# Patient Record
Sex: Female | Born: 1985 | Race: White | Hispanic: No | Marital: Single | Smoking: Current every day smoker
Health system: Southern US, Community
[De-identification: ages and names within clinical notes are randomized; demographics above are authoritative.]

## PROBLEM LIST (undated history)

## (undated) DIAGNOSIS — O99213 Obesity complicating pregnancy, third trimester: Secondary | ICD-10-CM

## (undated) DIAGNOSIS — Z6830 Body mass index (BMI) 30.0-30.9, adult: Secondary | ICD-10-CM

## (undated) HISTORY — PX: NO PAST SURGERIES: SHX2092

---

## 2005-01-28 ENCOUNTER — Emergency Department: Payer: Self-pay | Admitting: Emergency Medicine

## 2006-01-31 ENCOUNTER — Emergency Department: Payer: Self-pay | Admitting: Emergency Medicine

## 2006-04-28 ENCOUNTER — Emergency Department: Payer: Self-pay | Admitting: Emergency Medicine

## 2006-04-28 ENCOUNTER — Other Ambulatory Visit: Payer: Self-pay

## 2006-05-12 ENCOUNTER — Emergency Department: Payer: Self-pay | Admitting: Emergency Medicine

## 2006-08-23 ENCOUNTER — Inpatient Hospital Stay: Payer: Self-pay

## 2007-06-05 ENCOUNTER — Emergency Department: Payer: Self-pay | Admitting: Emergency Medicine

## 2007-08-10 ENCOUNTER — Emergency Department: Payer: Self-pay | Admitting: Internal Medicine

## 2010-11-09 ENCOUNTER — Ambulatory Visit: Payer: Self-pay | Admitting: Internal Medicine

## 2014-07-29 NOTE — L&D Delivery Note (Signed)
Delivery Note At 3:09 PM a viable female was delivered via Vaginal, Spontaneous Delivery (Presentation: Right Occiput Anterior).  APGAR: 9, 10; weight pending  .   Placenta status: Intact, Spontaneous.  Cord: 3 vessels with the following complications: .  Cord pH: n/a  Anesthesia: None  Episiotomy: None Lacerations: 2nd degree;Perineal;Vaginal Suture Repair: 3.0 vicryl in standard fashion Est. Blood Loss (mL): 400  Mom to postpartum.  Baby to Couplet care / Skin to Skin.  Called to see patient.  Mom pushed to delivery viable female infant.  The head followed by shoulders, which delivered without difficulty, and the rest of the body. The right arm was across the face, but moved to minimize perineal trauma.  No nuchal cord noted.  Baby to mom's chest.  Cord clamped and cut after > 1 min delay.  Cord blood obtained.  Placenta delivered spontaneously, intact, with a 3-vessel cord.  Second degree perineal laceration repaired with 3-0 Vicryl in standard fashion.  All counts correct.  Hemostasis obtained with fundal massage. EBL 400 mL.     Conard NovakJackson, Freja Faro D, MD 07/07/2015, 3:36 PM

## 2015-07-07 ENCOUNTER — Encounter: Payer: Self-pay | Admitting: *Deleted

## 2015-07-07 ENCOUNTER — Inpatient Hospital Stay
Admission: EM | Admit: 2015-07-07 | Discharge: 2015-07-08 | DRG: 775 | Disposition: A | Discharge status: Home / Self Care | Payer: Medicaid Other | Attending: Obstetrics and Gynecology | Admitting: Obstetrics and Gynecology

## 2015-07-07 DIAGNOSIS — O99213 Obesity complicating pregnancy, third trimester: Secondary | ICD-10-CM | POA: Diagnosis present

## 2015-07-07 DIAGNOSIS — Z6830 Body mass index (BMI) 30.0-30.9, adult: Secondary | ICD-10-CM

## 2015-07-07 DIAGNOSIS — Z3A38 38 weeks gestation of pregnancy: Secondary | ICD-10-CM

## 2015-07-07 DIAGNOSIS — O99214 Obesity complicating childbirth: Principal | ICD-10-CM | POA: Diagnosis present

## 2015-07-07 HISTORY — DX: Body mass index (BMI) 30.0-30.9, adult: Z68.30

## 2015-07-07 HISTORY — DX: Obesity complicating pregnancy, third trimester: O99.213

## 2015-07-07 LAB — CBC
HEMATOCRIT: 41.5 % (ref 35.0–47.0)
Hemoglobin: 13.9 g/dL (ref 12.0–16.0)
MCH: 32.2 pg (ref 26.0–34.0)
MCHC: 33.5 g/dL (ref 32.0–36.0)
MCV: 95.9 fL (ref 80.0–100.0)
PLATELETS: 179 10*3/uL (ref 150–440)
RBC: 4.32 MIL/uL (ref 3.80–5.20)
RDW: 13.9 % (ref 11.5–14.5)
WBC: 16.3 10*3/uL — AB (ref 3.6–11.0)

## 2015-07-07 LAB — TYPE AND SCREEN
ABO/RH(D): O NEG
Antibody Screen: POSITIVE

## 2015-07-07 LAB — RAPID HIV SCREEN (HIV 1/2 AB+AG)
HIV 1/2 ANTIBODIES: NONREACTIVE
HIV-1 P24 Antigen - HIV24: NONREACTIVE

## 2015-07-07 LAB — ABO/RH: ABO/RH(D): O NEG

## 2015-07-07 MED ORDER — LIDOCAINE HCL (PF) 1 % IJ SOLN
30.0000 mL | INTRAMUSCULAR | Status: DC | PRN
  Administered 2015-07-07: 30 mL via SUBCUTANEOUS
  Filled 2015-07-07: qty 30

## 2015-07-07 MED ORDER — DIBUCAINE 1 % RE OINT: 1.0000 "application " | TOPICAL_OINTMENT | RECTAL | Status: DC | PRN

## 2015-07-07 MED ORDER — ONDANSETRON HCL 4 MG/2ML IJ SOLN: 4.0000 mg | INTRAMUSCULAR | Status: DC | PRN

## 2015-07-07 MED ORDER — LACTATED RINGERS IV SOLN
500.0000 mL | INTRAVENOUS | Status: DC | PRN
Start: 2015-07-07 — End: 2015-07-07

## 2015-07-07 MED ORDER — LACTATED RINGERS IV SOLN: INTRAVENOUS | Status: DC

## 2015-07-07 MED ORDER — ONDANSETRON HCL 4 MG/2ML IJ SOLN: 4.0000 mg | Freq: Four times a day (QID) | INTRAMUSCULAR | Status: DC | PRN

## 2015-07-07 MED ORDER — IBUPROFEN 600 MG PO TABS
600.0000 mg | ORAL_TABLET | Freq: Four times a day (QID) | ORAL | Status: DC
  Administered 2015-07-07 – 2015-07-08 (×4): 600 mg via ORAL
  Filled 2015-07-07 (×5): qty 1

## 2015-07-07 MED ORDER — BENZOCAINE-MENTHOL 20-0.5 % EX AERO: 1.0000 "application " | INHALATION_SPRAY | CUTANEOUS | Status: DC | PRN

## 2015-07-07 MED ORDER — ONDANSETRON HCL 4 MG PO TABS: 4.0000 mg | ORAL_TABLET | ORAL | Status: DC | PRN

## 2015-07-07 MED ORDER — HYDROCODONE-ACETAMINOPHEN 5-325 MG PO TABS
1.0000 | ORAL_TABLET | Freq: Four times a day (QID) | ORAL | Status: DC | PRN
  Administered 2015-07-08: 1 via ORAL
  Filled 2015-07-07: qty 1

## 2015-07-07 MED ORDER — ACETAMINOPHEN 325 MG PO TABS: 650.0000 mg | ORAL_TABLET | ORAL | Status: DC | PRN

## 2015-07-07 MED ORDER — SIMETHICONE 80 MG PO CHEW: 80.0000 mg | CHEWABLE_TABLET | ORAL | Status: DC | PRN

## 2015-07-07 MED ORDER — DIPHENHYDRAMINE HCL 25 MG PO CAPS: 25.0000 mg | ORAL_CAPSULE | Freq: Four times a day (QID) | ORAL | Status: DC | PRN

## 2015-07-07 MED ORDER — HYDROCODONE-ACETAMINOPHEN 5-325 MG PO TABS: 2.0000 | ORAL_TABLET | Freq: Four times a day (QID) | ORAL | Status: DC | PRN

## 2015-07-07 MED ORDER — SENNOSIDES-DOCUSATE SODIUM 8.6-50 MG PO TABS
2.0000 | ORAL_TABLET | ORAL | Status: DC
  Administered 2015-07-07: 2 via ORAL
  Filled 2015-07-07: qty 2

## 2015-07-07 MED ORDER — OXYTOCIN 40 UNITS IN LACTATED RINGERS INFUSION - SIMPLE MED
62.5000 mL/h | INTRAVENOUS | Status: DC
  Filled 2015-07-07: qty 1000

## 2015-07-07 MED ORDER — FERROUS SULFATE 325 (65 FE) MG PO TABS
325.0000 mg | ORAL_TABLET | Freq: Two times a day (BID) | ORAL | Status: DC
  Administered 2015-07-07 – 2015-07-08 (×3): 325 mg via ORAL
  Filled 2015-07-07 (×3): qty 1

## 2015-07-07 MED ORDER — OXYTOCIN 40 UNITS IN LACTATED RINGERS INFUSION - SIMPLE MED
62.5000 mL/h | INTRAVENOUS | Status: DC | PRN
  Filled 2015-07-07: qty 1000

## 2015-07-07 MED ORDER — PRENATAL MULTIVITAMIN CH
1.0000 | ORAL_TABLET | Freq: Every day | ORAL | Status: DC
  Administered 2015-07-08: 1 via ORAL
  Filled 2015-07-07: qty 1

## 2015-07-07 MED ORDER — LANOLIN HYDROUS EX OINT: TOPICAL_OINTMENT | CUTANEOUS | Status: DC | PRN

## 2015-07-07 MED ORDER — OXYTOCIN BOLUS FROM INFUSION: 500.0000 mL | INTRAVENOUS | Status: DC

## 2015-07-07 MED ORDER — OXYTOCIN 10 UNIT/ML IJ SOLN: 10.0000 [IU] | Freq: Once | INTRAMUSCULAR | Status: DC

## 2015-07-07 MED ORDER — WITCH HAZEL-GLYCERIN EX PADS: 1.0000 "application " | MEDICATED_PAD | CUTANEOUS | Status: DC | PRN

## 2015-07-07 MED ORDER — CITRIC ACID-SODIUM CITRATE 334-500 MG/5ML PO SOLN: 30.0000 mL | ORAL | Status: DC | PRN

## 2015-07-07 NOTE — Discharge Summary (Signed)
OB Discharge Summary  Patient Name: Rodman CompMichelle L Caplin DOB: 08/05/1985 MRN: [id number]  Date of admission: 07/07/2015 Delivering MD: Thomasene MohairStephen Jackson, MD Date of Delivery: 07/07/2015  Date of discharge: 07/08/2015  Admitting diagnosis:  Patient Active Problem List   Diagnosis Date Noted  . Labor and delivery, indication for care 07/07/2015  . Obesity affecting pregnancy in third trimester, antepartum   . BMI 30.0-30.9,adult    Intrauterine pregnancy: 6249w1d     Secondary diagnosis: None     Discharge diagnosis: Term Pregnancy Delivered                                                                                                Post partum procedures:rhogam  Augmentation: none  Complications: None  Hospital course:  Onset of Labor With Vaginal Delivery     29 y.o. yo [id number] at 6449w1d was admitted in Active Laboron 07/07/2015. Patient had an uncomplicated labor course as follows:  Membrane Rupture Time/Date: 2:25 PM ,07/07/2015   Intrapartum Procedures: Episiotomy: None [1]                                         Lacerations:  2nd degree [3];Perineal [11];Vaginal [6]  Patient had a delivery of a Viable infant. 07/07/2015  Information for the patient's newborn:  Lissa HoardRussell, Girl Aqsa [[id number]][[id number]]  Delivery Method: Vag-Spont     Pateint had an uncomplicated postpartum course.  She is ambulating, tolerating a regular diet, passing flatus, and urinating well. Patient is discharged home in stable condition on No discharge date for patient encounter.Marland Kitchen.    Physical exam  Filed Vitals:   07/07/15 2352 07/08/15 0356 07/08/15 0748 07/08/15 1255  BP: 90/50 99/61 102/54 106/67  Pulse: 60 52 56 65  Temp: 98 F (36.7 C) 98.3 F (36.8 C) 98 F (36.7 C) 98 F (36.7 C)  TempSrc: Oral Oral Oral Oral  Resp: 20 20 18 20   Height:      Weight:      SpO2:   98%    General: alert Lochia: appropriate Uterine Fundus: firm Incision: N/A DVT Evaluation: No evidence of DVT seen on physical  exam.  Labs: Lab Results  Component Value Date   WBC 17.7* 07/08/2015   HGB 12.5 07/08/2015   HCT 36.4 07/08/2015   MCV 94.5 07/08/2015   PLT 152 07/08/2015   No flowsheet data found.  Discharge instruction: per After Visit Summary.  Medications:    Medication List    TAKE these medications        docusate sodium 100 MG capsule  Commonly known as:  COLACE  Take 1 capsule (100 mg total) by mouth 2 (two) times daily.     HYDROcodone-acetaminophen 5-325 MG tablet  Commonly known as:  NORCO/VICODIN  Take 1 tablet by mouth every 6 (six) hours as needed for moderate pain or severe pain.     ibuprofen 600 MG tablet  Commonly known as:  ADVIL,MOTRIN  Take 1 tablet (600 mg total) by mouth every 6 (six) hours  as needed.     norethindrone 0.35 MG tablet  Commonly known as:  MICRONOR,CAMILA,ERRIN  Take 1 tablet (0.35 mg total) by mouth daily.  Start taking on:  07/28/2015     prenatal multivitamin Tabs tablet  Take 1 tablet by mouth daily at 12 noon.        Diet: routine diet  Activity: Advance as tolerated. Pelvic rest for 6 weeks.   Outpatient follow up: No Follow-up on file.  Postpartum contraception: Progesterone only pills Rhogam Given postpartum: not needed Rubella vaccine given postpartum: no Varicella vaccine given postpartum: no TDaP given antepartum or postpartum: TDaP given 03/03/15 Flu vaccine given antepartum or postpartum: yes  Newborn Data: Live born female  Birth Weight:   APGAR: 9, 10   Baby Feeding: Breast  Disposition:home with mother  Cornelia Copa MD [facility]  Pager: [phone]

## 2015-07-07 NOTE — H&P (Signed)
OB History & Physical   History of Present Illness:  Chief Complaint: contractions  HPI:  Allison Bautista is a 29 y.o. 52P1001 female at 6370w1d dated by LMP consistent with 8 wk US.  Her pregnancy has been complicated by obesity with bmi 30, chlamydia with neg toc 06/28/15.    She reports contractions.   She denies leakage of fluid.   She denies vginal bleeding.   She reports fetal movement.    Maternal Medical History:   Past Medical History  Diagnosis Date  . Obesity affecting pregnancy in third trimester, antepartum   . BMI 30.0-30.9,adult     Past Surgical History  Procedure Laterality Date  . No past surgeries      No Known Allergies  Prior to Admission medications   Not on File       OB History  Gravida Para Term Preterm AB SAB TAB Ectopic Multiple Living  2 1 1       1     # Outcome Date GA Lbr Len/2nd Weight Sex Delivery Anes PTL Lv  2 Current           1 Term 08/23/06    Heide ScalesM Vag-Spont   Y      [facility]  Social History: She  reports that she has been smoking Cigarettes.  She has been smoking about 0.50 packs per day. She does not have any smokeless tobacco history on file. She reports that she does not drink alcohol or use illicit drugs.  Family History: family history is not on file.   Review of Systems: Negative x 10 systems reviewed except as noted in the HPI.    Physical Exam:  Vital Signs: BP 123/87 mmHg  Pulse 102  Temp(Src) 98.3 F (36.8 C) (Oral)  Resp 18  Ht 5\' 4"  (1.626 m)  Wt 82.101 kg (181 lb)  BMI 31.05 kg/m2  LMP 10/13/2014 General: no acute distress.  HEENT: normocephalic, atraumatic Heart: regular rate & rhythm.  No murmurs/rubs/gallops Lungs: clear to auscultation bilaterally Abdomen: soft, gravid, non-tender;  EFW: 7 pounds Pelvic:   External: Normal external female genitalia  Cervix: Dilation: 7 / Effacement (%): 90 / Station: -2    Extremities: non-tender, symmetric,   Neurologic: Alert & oriented  x 3.    Pertinent Results:  Prenatal Labs: Blood type/Rh O negative  Antibody screen negative  Rubella Varicella Immune Immune  RPR NR  HBsAg negative  HIV negative  GC negative  Chlamydia Positive Neg TOC in July and Nov 2016  Genetic screening Neg 1st trimester  1 hour GTT passed  3 hour GTT NA  GBS negative on 06/28/15   Baseline FHR: 140 beats/min   Variability: moderate   Accelerations: present   Decelerations: absent Contractions: present frequency: 4-5 Q10 m Overall assessment: Category 1    Assessment:  Allison Bautista is a 29 y.o. 232P1001 female at 270w1d with Active Labor.   Plan:  1. Admit to Labor & Delivery  2. CBC, T&S, Clrs, IVF 3. GBS negative.   4. Fetal well-being: Reassuring  Tresea MallGLEDHILL,Wylee Dorantes, CNM 07/07/2015 1:42 PM   This patient and plan was discussed with Dr Jean RosenthalJackson  07/07/2015

## 2015-07-08 LAB — CBC
HEMATOCRIT: 36.4 % (ref 35.0–47.0)
HEMOGLOBIN: 12.5 g/dL (ref 12.0–16.0)
MCH: 32.4 pg (ref 26.0–34.0)
MCHC: 34.2 g/dL (ref 32.0–36.0)
MCV: 94.5 fL (ref 80.0–100.0)
Platelets: 152 10*3/uL (ref 150–440)
RBC: 3.85 MIL/uL (ref 3.80–5.20)
RDW: 13.5 % (ref 11.5–14.5)
WBC: 17.7 10*3/uL — AB (ref 3.6–11.0)

## 2015-07-08 MED ORDER — IBUPROFEN 600 MG PO TABS: 600.0000 mg | ORAL_TABLET | Freq: Four times a day (QID) | ORAL | Status: DC | PRN

## 2015-07-08 MED ORDER — INFLUENZA VAC SPLIT QUAD 0.5 ML IM SUSY: 0.5000 mL | PREFILLED_SYRINGE | INTRAMUSCULAR | Status: DC

## 2015-07-08 MED ORDER — HYDROCODONE-ACETAMINOPHEN 5-325 MG PO TABS: 1.0000 | ORAL_TABLET | Freq: Four times a day (QID) | ORAL | Status: DC | PRN

## 2015-07-08 MED ORDER — DOCUSATE SODIUM 100 MG PO CAPS: 100.0000 mg | ORAL_CAPSULE | Freq: Two times a day (BID) | ORAL | Status: DC

## 2015-07-08 MED ORDER — TETANUS-DIPHTH-ACELL PERTUSSIS 5-2.5-18.5 LF-MCG/0.5 IM SUSP: 0.5000 mL | Freq: Once | INTRAMUSCULAR | Status: DC

## 2015-07-08 MED ORDER — PRENATAL MULTIVITAMIN CH: 1.0000 | ORAL_TABLET | Freq: Every day | ORAL | Status: DC

## 2015-07-08 MED ORDER — NORETHINDRONE 0.35 MG PO TABS: 1.0000 | ORAL_TABLET | Freq: Every day | ORAL | Status: DC

## 2015-07-08 NOTE — Progress Notes (Signed)
All pt discharge instructions completed; pt discharged at this time via wheelchair escorted by RN; pt going home with husband, new baby and other family members

## 2015-07-08 NOTE — Progress Notes (Signed)
Daily Post Partum Note  Allison Bautista is a 29 y.o. [id number] PPD#1 s/p  SVD/2nd  @ 8152w1d.  Pregnancy c/b Rh negative and h/o CT with negative TOC  24hr/overnight events:  none  Subjective:  Meeting all PP goals  Objective:    Current Vital Signs 24h Vital Sign Ranges  T 98 F (36.7 C) Temp  Avg: 98.3 F (36.8 C)  Min: 98 F (36.7 C)  Max: 99.2 F (37.3 C)  BP (!) 102/54 mmHg BP  Min: 90/50  Max: 129/76  HR (!) 56 Pulse  Avg: 83.1  Min: 52  Max: 106  RR 18 Resp  Avg: 18.3  Min: 16  Max: 20  SaO2 98 % Not Delivered SpO2  Avg: 99 %  Min: 98 %  Max: 100 %       24 Hour I/O Current Shift I/O  Time Ins Outs 12/09 0701 - 12/10 0700 In: -  Out: 700 [Urine:300]      General: NAD Abdomen: FF below the umbilicus, nttp Perineum: deferred Skin:  Warm and dry.  Cardiovascular: Regular rate and rhythm. Respiratory:  Clear to auscultation bilateral. Normal respiratory effort Extremities: no c/c/e  Medications Current Facility-Administered Medications  Medication Dose Route Frequency Provider Last Rate Last Dose  . acetaminophen (TYLENOL) tablet 650 mg  650 mg Oral Q4H PRN Conard NovakStephen D Jackson, MD      . benzocaine-Menthol (DERMOPLAST) 20-0.5 % topical spray 1 application  1 application Topical PRN Conard NovakStephen D Jackson, MD      . witch hazel-glycerin (TUCKS) pad 1 application  1 application Topical PRN Conard NovakStephen D Jackson, MD       And  . dibucaine (NUPERCAINAL) 1 % rectal ointment 1 application  1 application Rectal PRN Conard NovakStephen D Jackson, MD      . diphenhydrAMINE (BENADRYL) capsule 25 mg  25 mg Oral Q6H PRN Conard NovakStephen D Jackson, MD      . ferrous sulfate tablet 325 mg  325 mg Oral BID WC Conard NovakStephen D Jackson, MD   325 mg at 07/07/15 1817  . HYDROcodone-acetaminophen (NORCO/VICODIN) 5-325 MG per tablet 1 tablet  1 tablet Oral Q6H PRN Conard NovakStephen D Jackson, MD       Or  . HYDROcodone-acetaminophen (NORCO/VICODIN) 5-325 MG per tablet 2 tablet  2 tablet Oral Q6H PRN Conard NovakStephen D Jackson, MD      .  ibuprofen (ADVIL,MOTRIN) tablet 600 mg  600 mg Oral 4 times per day Conard NovakStephen D Jackson, MD   600 mg at 07/08/15 0538  . lanolin ointment   Topical PRN Conard NovakStephen D Jackson, MD      . lidocaine (PF) (XYLOCAINE) 1 % injection 30 mL  30 mL Subcutaneous PRN Conard NovakStephen D Jackson, MD   30 mL at 07/07/15 1520  . ondansetron (ZOFRAN) tablet 4 mg  4 mg Oral Q4H PRN Conard NovakStephen D Jackson, MD      . prenatal multivitamin tablet 1 tablet  1 tablet Oral Q1200 Conard NovakStephen D Jackson, MD      . senna-docusate (Senokot-S) tablet 2 tablet  2 tablet Oral Q24H Conard NovakStephen D Jackson, MD   2 tablet at 07/07/15 2359  . simethicone (MYLICON) chewable tablet 80 mg  80 mg Oral PRN Conard NovakStephen D Jackson, MD        Labs:   Recent Labs Lab 07/07/15 1329 07/08/15 0524  WBC 16.3* 17.7*  HGB 13.9 12.5  HCT 41.5 36.4  PLT 179 152    Assessment & Plan:  Pt doing well *Postpartum/postop: routine  care. Rpt pap oneyear *Rh negative: baby is Rh negative *Dispo: okay for d/c today; pt to consider  O NEG / Rubella Immune / Varicella Immune/  RPR negative / HIV negative / HepBsAg negative / Tdap UTD: ordered/Flu shot: ordered/ pap negative but HPV pos 2016 / Breast  / Contraception: OCP / [facility]  Cornelia Copa MD [facility] Pager [phone]

## 2015-07-08 NOTE — Discharge Instructions (Addendum)
° °Vaginal Delivery, Care After °Refer to this sheet in the next few weeks. These discharge instructions provide you with information on caring for yourself after delivery. Your caregiver may also give you specific instructions. Your treatment has been planned according to the most current medical practices available, but problems sometimes occur. Call your caregiver if you have any problems or questions after you go home. °HOME CARE INSTRUCTIONS °1. Take over-the-counter or prescription medicines only as directed by your caregiver or pharmacist. °2. Do not drink alcohol, especially if you are breastfeeding or taking medicine to relieve pain. °3. Do not smoke tobacco. °4. Continue to use good perineal care. Good perineal care includes: °1. Wiping your perineum from back to front °2. Keeping your perineum clean. °3. You can do sitz baths twice a day, to help keep this area clean °5. Do not use tampons, douche or have sex until your caregiver says it is okay. °6. Shower only and avoid sitting in submerged water, aside from sitz baths °7. Wear a well-fitting bra that provides breast support. °8. Eat healthy foods. °9. Drink enough fluids to keep your urine clear or pale yellow. °10. Eat high-fiber foods such as whole grain cereals and breads, brown rice, beans, and fresh fruits and vegetables every day. These foods may help prevent or relieve constipation. °11. Avoid constipation with high fiber foods or medications, such as miralax or metamucil °12. Follow your caregiver's recommendations regarding resumption of activities such as climbing stairs, driving, lifting, exercising, or traveling. °13. Talk to your caregiver about resuming sexual activities. Resumption of sexual activities is dependent upon your risk of infection, your rate of healing, and your comfort and desire to resume sexual activity. °14. Try to have someone help you with your household activities and your newborn for at least a few days after you  leave the hospital. °15. Rest as much as possible. Try to rest or take a nap when your newborn is sleeping. °16. Increase your activities gradually. °17. Keep all of your scheduled postpartum appointments. It is very important to keep your scheduled follow-up appointments. At these appointments, your caregiver will be checking to make sure that you are healing physically and emotionally. °SEEK MEDICAL CARE IF:  °· You are passing large clots from your vagina. Save any clots to show your caregiver. °· You have a foul smelling discharge from your vagina. °· You have trouble urinating. °· You are urinating frequently. °· You have pain when you urinate. °· You have a change in your bowel movements. °· You have increasing redness, pain, or swelling near your vaginal incision (episiotomy) or vaginal tear. °· You have pus draining from your episiotomy or vaginal tear. °· Your episiotomy or vaginal tear is separating. °· You have painful, hard, or reddened breasts. °· You have a severe headache. °· You have blurred vision or see spots. °· You feel sad or depressed. °· You have thoughts of hurting yourself or your newborn. °· You have questions about your care, the care of your newborn, or medicines. °· You are dizzy or light-headed. °· You have a rash. °· You have nausea or vomiting. °· You were breastfeeding and have not had a menstrual period within 12 weeks after you stopped breastfeeding. °· You are not breastfeeding and have not had a menstrual period by the 12th week after delivery. °· You have a fever. °SEEK IMMEDIATE MEDICAL CARE IF:  °· You have persistent pain. °· You have chest pain. °· You have shortness of breath. °·   You faint. °· You have leg pain. °· You have stomach pain. °· Your vaginal bleeding saturates two or more sanitary pads in 1 hour. °MAKE SURE YOU:  °· Understand these instructions. °· Will watch your condition. °· Will get help right away if you are not doing well or get worse. °Document Released:  07/12/2000 Document Revised: 11/29/2013 Document Reviewed: 03/11/2012 °ExitCare® Patient Information ©2015 [employer]. This information is not intended to replace advice given to you by your health care provider. Make sure you discuss any questions you have with your health care provider. ° °Sitz Bath °A sitz bath is a warm water bath taken in the sitting position. The water covers only the hips and butt (buttocks). We recommend using one that fits in the toilet, to help with ease of use and cleanliness. It may be used for either healing or cleaning purposes. Sitz baths are also used to relieve pain, itching, or muscle tightening (spasms). The water may contain medicine. Moist heat will help you heal and relax.  °HOME CARE  °Take 3 to 4 sitz baths a day. °18. Fill the bathtub half-full with warm water. °19. Sit in the water and open the drain a little. °20. Turn on the warm water to keep the tub half-full. Keep the water running constantly. °21. Soak in the water for 15 to 20 minutes. °22. After the sitz bath, pat the affected area dry. °GET HELP RIGHT AWAY IF: °You get worse instead of better. Stop the sitz baths if you get worse. °MAKE SURE YOU: °· Understand these instructions. °· Will watch your condition. °· Will get help right away if you are not doing well or get worse. °Document Released: 08/22/2004 Document Revised: 04/08/2012 Document Reviewed: 11/12/2010 °ExitCare® Patient Information ©2015 [employer]. This information is not intended to replace advice given to you by your health care provider. Make sure you discuss any questions you have with your health care provider. ° °Call your doctor for increased pain or vaginal bleeding, temperature above 100.4, depression, or concerns.  No strenuous activity or heavy lifting for 6 weeks.  No intercourse, tampons, douching, or enemas for 6 weeks.  No tub baths-showers only.  No driving for 2 weeks or while taking pain medications.  Continue prenatal vitamin  and iron.   °

## 2015-07-08 NOTE — Progress Notes (Signed)
Pt states that she received TDaP and Influenza vaccines during pregnancy.  Pt declines all vaccines at this time. Reynold BowenSusan Paisley Loyd Marhefka, RN 07/08/2015 6:11 PM

## 2015-07-09 LAB — RPR: RPR Ser Ql: NONREACTIVE

## 2017-01-28 ENCOUNTER — Encounter: Payer: Self-pay | Admitting: Emergency Medicine

## 2017-01-28 ENCOUNTER — Ambulatory Visit
Admission: EM | Admit: 2017-01-28 | Discharge: 2017-01-28 | Disposition: A | Discharge status: Home / Self Care | Payer: Medicaid Other | Attending: Family Medicine | Admitting: Family Medicine

## 2017-01-28 DIAGNOSIS — W57XXXA Bitten or stung by nonvenomous insect and other nonvenomous arthropods, initial encounter: Secondary | ICD-10-CM | POA: Diagnosis not present

## 2017-01-28 DIAGNOSIS — L03116 Cellulitis of left lower limb: Secondary | ICD-10-CM

## 2017-01-28 DIAGNOSIS — S80862A Insect bite (nonvenomous), left lower leg, initial encounter: Secondary | ICD-10-CM

## 2017-01-28 MED ORDER — SULFAMETHOXAZOLE-TRIMETHOPRIM 800-160 MG PO TABS: 1.0000 | ORAL_TABLET | Freq: Two times a day (BID) | ORAL | 0 refills | Status: DC

## 2017-01-28 MED ORDER — MUPIROCIN 2 % EX OINT: 1.0000 "application " | TOPICAL_OINTMENT | Freq: Two times a day (BID) | CUTANEOUS | 0 refills | Status: DC

## 2017-01-28 MED ORDER — LORATADINE 10 MG PO TABS: 10.0000 mg | ORAL_TABLET | Freq: Every day | ORAL | 0 refills | Status: DC

## 2017-01-28 MED ORDER — CETIRIZINE HCL 10 MG PO TABS: 10.0000 mg | ORAL_TABLET | Freq: Every day | ORAL | 0 refills | Status: DC

## 2017-01-28 MED ORDER — RANITIDINE HCL 150 MG PO CAPS: 150.0000 mg | ORAL_CAPSULE | Freq: Two times a day (BID) | ORAL | 0 refills | Status: DC

## 2017-01-28 NOTE — ED Provider Notes (Signed)
[facility]    CSN: [id number] Arrival date & time: 01/28/17  1153     History   Chief Complaint Chief Complaint  Patient presents with  . Insect Bite    HPI [name] is a 31 y.o. female.   Patient reports being bitten by a wasp on Saturday at the pool since then she's had swelling of her left lower calf itching and redness. She states is progressively getting worse she did see there was a wasp and she did not want off her leg. She does smoke she is on no chronic medications no previous surgeries no pertinent family medical history no known drug allergies   The history is provided by the patient. No language interpreter was used.    Past Medical History:  Diagnosis Date  . BMI 30.0-30.9,adult   . Obesity affecting pregnancy in third trimester, antepartum     Patient Active Problem List   Diagnosis Date Noted  . Labor and delivery, indication for care 07/07/2015  . Obesity affecting pregnancy in third trimester, antepartum   . BMI 30.0-30.9,adult     Past Surgical History:  Procedure Laterality Date  . NO PAST SURGERIES      OB History    Gravida Para Term Preterm AB Living   2 2 2     2    SAB TAB Ectopic Multiple Live Births         0 2       Home Medications    Prior to Admission medications   Medication Sig Start Date End Date Taking? Authorizing Provider  cetirizine (ZYRTEC) 10 MG tablet Take 1 tablet (10 mg total) by mouth daily. If needed at night for itching not relieved by Claritin in the morning. 01/28/17   Hassan RowanWade, Lamari Youngers, MD  loratadine (CLARITIN) 10 MG tablet Take 1 tablet (10 mg total) by mouth daily. Take 1 tablet in the morning. As needed for itching. 01/28/17   Hassan RowanWade, Ginevra Tacker, MD  mupirocin ointment (BACTROBAN) 2 % Apply 1 application topically 2 (two) times daily. 01/28/17   Hassan RowanWade, Paitlyn Mcclatchey, MD  ranitidine (ZANTAC) 150 MG capsule Take 1 capsule (150 mg total) by mouth 2 (two) times daily. 01/28/17   Hassan RowanWade, Vashti Bolanos, MD    sulfamethoxazole-trimethoprim (BACTRIM DS,SEPTRA DS) 800-160 MG tablet Take 1 tablet by mouth 2 (two) times daily. 01/28/17   Hassan RowanWade, Jylan Loeza, MD    Family History History reviewed. No pertinent family history.  Social History Social History  Substance Use Topics  . Smoking status: Current Every Day Smoker    Packs/day: 0.50    Types: Cigarettes  . Smokeless tobacco: Never Used  . Alcohol use No     Allergies   Doxycycline   Review of Systems Review of Systems  Musculoskeletal: Positive for myalgias.  Skin: Positive for wound.  All other systems reviewed and are negative.    Physical Exam Triage Vital Signs ED Triage Vitals  Enc Vitals Group     BP 01/28/17 1216 119/73     Pulse Rate 01/28/17 1216 96     Resp 01/28/17 1216 16     Temp 01/28/17 1216 98.9 F (37.2 C)     Temp Source 01/28/17 1216 Oral     SpO2 01/28/17 1216 98 %     Weight 01/28/17 1213 153 lb (69.4 kg)     Height 01/28/17 1213 5\' 4"  (1.626 m)     Head Circumference --      Peak Flow --  Pain Score 01/28/17 1214 4     Pain Loc --      Pain Edu? --      Excl. in GC? --    No data found.   Updated Vital Signs BP 119/73 (BP Location: Right Arm)   Pulse 96   Temp 98.9 F (37.2 C) (Oral)   Resp 16   Ht 5\' 4"  (1.626 m)   Wt 153 lb (69.4 kg)   LMP 01/21/2017 (Approximate)   SpO2 98%   Breastfeeding? No   BMI 26.26 kg/m   Visual Acuity Right Eye Distance:   Left Eye Distance:   Bilateral Distance:    Right Eye Near:   Left Eye Near:    Bilateral Near:     Physical Exam  Constitutional: She appears well-developed and well-nourished. No distress.  HENT:  Head: Normocephalic and atraumatic.  Eyes: Pupils are equal, round, and reactive to light.  Neck: Normal range of motion. Neck supple.  Pulmonary/Chest: Effort normal.  Musculoskeletal: She exhibits tenderness. She exhibits no edema or deformity.  Neurological: She is alert.  Skin: She is not diaphoretic. There is erythema.   Psychiatric: She has a normal mood and affect.     UC Treatments / Results  Labs (all labs ordered are listed, but only abnormal results are displayed) Labs Reviewed - No data to display  EKG  EKG Interpretation None       Radiology No results found.  Procedures Procedures (including critical care time)  Medications Ordered in UC Medications - No data to display   Initial Impression / Assessment and Plan / UC Course  I have reviewed the triage vital signs and the nursing notes.  Pertinent labs & imaging results that were available during my care of the patient were reviewed by me and considered in my medical decision making (see chart for details).     We will place patient on Claritin 10 mg and Zantac twice a day for the itching and Zyrtec at night if needed and will place on Septra DS and Bactroban ointment for the insect bite cellulitis. Final Clinical Impressions(s) / UC Diagnoses   Final diagnoses:  Insect bite, initial encounter  Cellulitis of left lower extremity    New Prescriptions Discharge Medication List as of 01/28/2017  1:14 PM    START taking these medications   Details  mupirocin ointment (BACTROBAN) 2 % Apply 1 application topically 2 (two) times daily., Starting Tue 01/28/2017, Normal    sulfamethoxazole-trimethoprim (BACTRIM DS,SEPTRA DS) 800-160 MG tablet Take 1 tablet by mouth 2 (two) times daily., Starting Tue 01/28/2017, Normal         Note: This dictation was prepared with Dragon dictation along with smaller phrase technology. Any transcriptional errors that result from this process are unintentional.   Hassan Rowan, MD 01/28/17 [phone]

## 2017-01-28 NOTE — ED Triage Notes (Signed)
Patient states that she got stung by a wasp in her left lower leg on Sunday.  Patient c/o redness, swelling and tender that has gotten worse.

## 2017-03-04 ENCOUNTER — Ambulatory Visit
Admission: EM | Admit: 2017-03-04 | Discharge: 2017-03-04 | Disposition: A | Discharge status: Home / Self Care | Payer: Medicaid Other | Attending: Emergency Medicine | Admitting: Emergency Medicine

## 2017-03-04 ENCOUNTER — Encounter: Payer: Self-pay | Admitting: *Deleted

## 2017-03-04 DIAGNOSIS — S00462A Insect bite (nonvenomous) of left ear, initial encounter: Secondary | ICD-10-CM | POA: Diagnosis not present

## 2017-03-04 DIAGNOSIS — W57XXXA Bitten or stung by nonvenomous insect and other nonvenomous arthropods, initial encounter: Secondary | ICD-10-CM | POA: Diagnosis not present

## 2017-03-04 DIAGNOSIS — R21 Rash and other nonspecific skin eruption: Secondary | ICD-10-CM | POA: Diagnosis not present

## 2017-03-04 DIAGNOSIS — T7840XA Allergy, unspecified, initial encounter: Secondary | ICD-10-CM | POA: Diagnosis not present

## 2017-03-04 DIAGNOSIS — T63481A Toxic effect of venom of other arthropod, accidental (unintentional), initial encounter: Secondary | ICD-10-CM

## 2017-03-04 MED ORDER — PREDNISONE 10 MG (21) PO TBPK: ORAL_TABLET | ORAL | 0 refills | Status: DC

## 2017-03-04 MED ORDER — EPINEPHRINE 0.3 MG/0.3ML IJ SOAJ: 0.3000 mg | Freq: Once | INTRAMUSCULAR | 1 refills | Status: AC

## 2017-03-04 MED ORDER — PREDNISONE 50 MG PO TABS
60.0000 mg | ORAL_TABLET | Freq: Once | ORAL | Status: AC
  Administered 2017-03-04: 60 mg via ORAL

## 2017-03-04 MED ORDER — KETOROLAC TROMETHAMINE 60 MG/2ML IM SOLN
30.0000 mg | Freq: Once | INTRAMUSCULAR | Status: AC
  Administered 2017-03-04: 30 mg via INTRAMUSCULAR

## 2017-03-04 MED ORDER — DIPHENHYDRAMINE HCL 50 MG PO CAPS
50.0000 mg | ORAL_CAPSULE | Freq: Once | ORAL | Status: AC
  Administered 2017-03-04: 50 mg via ORAL

## 2017-03-04 NOTE — ED Triage Notes (Signed)
Pt stung by insect a short while ago, now c/o left neck redness and pain. Denies other symptoms.

## 2017-03-04 NOTE — Discharge Instructions (Signed)
Take the Claritin and ranitidine as directed on a regular basis for the next 5 or 6 days. Finish the prednisone. Use the EpiPen for any signs of anaphylaxis and go immediately to the emergency department

## 2017-03-04 NOTE — ED Provider Notes (Signed)
HPI  SUBJECTIVE:  Allison Bautista is a 31 y.o. female who presents with itching, burning pain and stabbing pain along her ear after being stung by an unknown insect on the left ear approximately an hour and 15 minutes prior to arrival. She reports erythema in the area and spreading over her neck. She reports swelling of her ear and posterior to the ear. She tried Claritin 10 mg and Zantac 150 mg without improvement in her symptoms. She also tried ice which made things worse. She denies lip, tongue swelling, difficulty breathing, sensation of throat swelling shut. No voice changes, wheezing, abdominal pain, diarrhea, syncope. Past medical history of cellulitis secondary to insect bite. No history of anaphylaxis to food or insect stings. No history of diabetes, hypertension. LMP: 2 weeks ago. States we do not need to check for pregnancy. PMD: [facility], she has not yet established care there.   Past Medical History:  Diagnosis Date  . BMI 30.0-30.9,adult   . Obesity affecting pregnancy in third trimester, antepartum     Past Surgical History:  Procedure Laterality Date  . NO PAST SURGERIES      History reviewed. No pertinent family history.  Social History  Substance Use Topics  . Smoking status: Current Every Day Smoker    Packs/day: 0.50    Types: Cigarettes  . Smokeless tobacco: Never Used  . Alcohol use No    No current facility-administered medications for this encounter.   Current Outpatient Prescriptions:  .  loratadine (CLARITIN) 10 MG tablet, Take 1 tablet (10 mg total) by mouth daily. Take 1 tablet in the morning. As needed for itching., Disp: 30 tablet, Rfl: 0 .  ranitidine (ZANTAC) 150 MG capsule, Take 1 capsule (150 mg total) by mouth 2 (two) times daily., Disp: 30 capsule, Rfl: 0 .  cetirizine (ZYRTEC) 10 MG tablet, Take 1 tablet (10 mg total) by mouth daily. If needed at night for itching not relieved by Claritin in the morning., Disp: 30 tablet,  Rfl: 0 .  EPINEPHrine 0.3 mg/0.3 mL IJ SOAJ injection, Inject 0.3 mLs (0.3 mg total) into the muscle once., Disp: 1 Device, Rfl: 1 .  mupirocin ointment (BACTROBAN) 2 %, Apply 1 application topically 2 (two) times daily., Disp: 22 g, Rfl: 0 .  predniSONE (STERAPRED UNI-PAK 21 TAB) 10 MG (21) TBPK tablet, Dispense one 6 day pack. Take as directed with food., Disp: 21 tablet, Rfl: 0  Allergies  Allergen Reactions  . Doxycycline Hives     ROS  As noted in HPI.   Physical Exam  BP 113/85 (BP Location: Left Arm)   Pulse 94   Temp 98.3 F (36.8 C) (Oral)   Resp 16   Ht 5\' 4"  (1.626 m)   Wt 150 lb (68 kg)   SpO2 100%   BMI 25.75 kg/m   Constitutional: Well developed, well nourished, no acute distress Eyes:  EOMI, conjunctiva normal bilaterally HENT: Normocephalic, atraumatic,mucus membranes moist. No angioedema  left ear swollen, erythematous, tender. Unable to find stinger. Respiratory: Normal inspiratory effort good air movement, no wheezing Cardiovascular: Normal rate GI: nondistended skin: 17 x 10 cm nontender blanchable area of erythema, increased temperature over her left neck. No appreciable induration      Musculoskeletal: no deformities Neurologic: Alert & oriented x 3, no focal neuro deficits Psychiatric: Speech and behavior appropriate   ED Course   Medications  ketorolac (TORADOL) injection 30 mg (30 mg Intramuscular Given 03/04/17 1304)  predniSONE (DELTASONE) tablet 60  mg (60 mg Oral Given 03/04/17 1304)  diphenhydrAMINE (BENADRYL) capsule 50 mg (50 mg Oral Given 03/04/17 1355)    No orders of the defined types were placed in this encounter.   No results found for this or any previous visit (from the past 24 hour(s)). No results found.  ED Clinical Impression  Allergic reaction, initial encounter  Insect stings, accidental or unintentional, initial encounter  ED Assessment/Plan  1334. Reevaluated patient. The erythema is spreading. She does have  some facial swelling around her left jaw. Having her take another 150 mg of Zantac for total 300. We'll give 50 mg of Benadryl. There is no angioedema, she denies tongue swelling, her lungs are clear. no urticaria, other flushing. We'll reevaluate in 20 minutes, if better then we'll send home with prednisone, H1 H2 blockers, cool compresses, EpiPen. If worse, will give epinephrine.  1415- patient given Benadryl at 1350. Reevaluation, patient states that her ear feels better, and the rash has not spread. She continues to deny throat swelling, lip swelling, tongue swelling. Lungs are clear. Will continue to observe.   1445- reevaluation, patient states she feels significantly better. The ear is less swollen, erythema is resolving. Still no evidence of anaphylaxis. She has plenty of Claritin and ranitidine. She is to take this on a regular basis for the next 5 days. 6 day prednisone taper, EpiPen prescribed.     Discussed  MDM, plan and followup with patient. Discussed sn/sx that should prompt return to the ED. Patient agrees with plan.   Meds ordered this encounter  Medications  . ketorolac (TORADOL) injection 30 mg  . predniSONE (DELTASONE) tablet 60 mg  . diphenhydrAMINE (BENADRYL) capsule 50 mg  . EPINEPHrine 0.3 mg/0.3 mL IJ SOAJ injection    Sig: Inject 0.3 mLs (0.3 mg total) into the muscle once.    Dispense:  1 Device    Refill:  1  . predniSONE (STERAPRED UNI-PAK 21 TAB) 10 MG (21) TBPK tablet    Sig: Dispense one 6 day pack. Take as directed with food.    Dispense:  21 tablet    Refill:  0    *This clinic note was created using Scientist, clinical (histocompatibility and immunogenetics). Therefore, there may be occasional mistakes despite careful proofreading.  ?   Domenick Gong, MD 03/04/17 1452

## 2017-05-27 ENCOUNTER — Ambulatory Visit: Payer: Medicaid Other | Attending: Specialist | Admitting: Physical Therapy

## 2017-09-17 ENCOUNTER — Ambulatory Visit: Payer: Self-pay | Admitting: Certified Nurse Midwife

## 2017-09-22 ENCOUNTER — Ambulatory Visit: Payer: Self-pay | Admitting: Certified Nurse Midwife

## 2019-01-08 ENCOUNTER — Emergency Department
Admission: EM | Admit: 2019-01-08 | Discharge: 2019-01-08 | Disposition: A | Discharge status: Home / Self Care | Payer: Medicaid Other | Attending: Emergency Medicine | Admitting: Emergency Medicine

## 2019-01-08 ENCOUNTER — Emergency Department: Payer: Medicaid Other

## 2019-01-08 ENCOUNTER — Encounter: Payer: Self-pay | Admitting: Emergency Medicine

## 2019-01-08 DIAGNOSIS — R079 Chest pain, unspecified: Secondary | ICD-10-CM | POA: Diagnosis present

## 2019-01-08 DIAGNOSIS — F17210 Nicotine dependence, cigarettes, uncomplicated: Secondary | ICD-10-CM | POA: Diagnosis not present

## 2019-01-08 DIAGNOSIS — R002 Palpitations: Secondary | ICD-10-CM | POA: Diagnosis not present

## 2019-01-08 LAB — BASIC METABOLIC PANEL WITH GFR
Anion gap: 8 (ref 5–15)
BUN: 9 mg/dL (ref 6–20)
CO2: 22 mmol/L (ref 22–32)
Calcium: 9.1 mg/dL (ref 8.9–10.3)
Chloride: 107 mmol/L (ref 98–111)
Creatinine, Ser: 0.53 mg/dL (ref 0.44–1.00)
GFR calc Af Amer: >60 mL/min
GFR calc non Af Amer: >60 mL/min
Glucose, Bld: 98 mg/dL (ref 70–99)
Potassium: 4 mmol/L (ref 3.5–5.1)
Sodium: 137 mmol/L (ref 135–145)

## 2019-01-08 LAB — TSH: TSH: 2.56 u[IU]/mL (ref 0.350–4.500)

## 2019-01-08 LAB — CBC
HCT: 44.4 % (ref 36.0–46.0)
Hemoglobin: 14.9 g/dL (ref 12.0–15.0)
MCH: 31.3 pg (ref 26.0–34.0)
MCHC: 33.6 g/dL (ref 30.0–36.0)
MCV: 93.3 fL (ref 80.0–100.0)
Platelets: 226 10*3/uL (ref 150–400)
RBC: 4.76 MIL/uL (ref 3.87–5.11)
RDW: 12.6 % (ref 11.5–15.5)
WBC: 13.4 10*3/uL — ABNORMAL HIGH (ref 4.0–10.5)
nRBC: 0 % (ref 0.0–0.2)

## 2019-01-08 LAB — TROPONIN I: Troponin I: <0.03 ng/mL

## 2019-01-08 MED ORDER — ALPRAZOLAM 0.25 MG PO TABS: 0.2500 mg | ORAL_TABLET | Freq: Three times a day (TID) | ORAL | 0 refills | Status: AC | PRN

## 2019-01-08 NOTE — ED Provider Notes (Signed)
[facility] Emergency Department Provider Note       Time seen: ----------------------------------------- 11:56 AM on 01/08/2019 -----------------------------------------   I have reviewed the triage vital signs and the nursing notes.  HISTORY   Chief Complaint Chest Pain and Anxiety    HPI Allison Bautista is a 33 y.o. female with a history of obesity who presents to the ED for palpitations last night.  Patient states she is had multiple feelings like her heart feels cold.  She has history of anxiety and states she started taking Lexapro 3 weeks ago.  She feels very tired.  Past Medical History:  Diagnosis Date  . BMI 30.0-30.9,adult   . Obesity affecting pregnancy in third trimester, antepartum     Patient Active Problem List   Diagnosis Date Noted  . Labor and delivery, indication for care 07/07/2015  . Obesity affecting pregnancy in third trimester, antepartum   . BMI 30.0-30.9,adult     Past Surgical History:  Procedure Laterality Date  . NO PAST SURGERIES      Allergies Doxycycline  Social History Social History   Tobacco Use  . Smoking status: Current Every Day Smoker    Packs/day: 0.50    Types: Cigarettes  . Smokeless tobacco: Never Used  Substance Use Topics  . Alcohol use: No  . Drug use: No   Review of Systems Constitutional: Negative for fever. Cardiovascular: Negative for chest pain.  Positive for palpitations Respiratory: Negative for shortness of breath. Gastrointestinal: Negative for abdominal pain, vomiting and diarrhea. Genitourinary: Negative for dysuria. Musculoskeletal: Negative for back pain. Skin: Negative for rash. Neurological: Negative for headaches, focal weakness or numbness.  All systems negative/normal/unremarkable except as stated in the HPI  ____________________________________________   PHYSICAL EXAM:  VITAL SIGNS: ED Triage Vitals [01/08/19 1156]  Enc Vitals Group     BP 132/71   Pulse Rate (!) 111     Resp (!) 22     Temp 98.1 F (36.7 C)     Temp Source Oral     SpO2 100 %     Weight      Height      Head Circumference      Peak Flow      Pain Score 0     Pain Loc      Pain Edu?      Excl. in GC?     Constitutional: Alert and oriented. Well appearing and in no distress. Eyes: Conjunctivae are normal. Normal extraocular movements. ENT      Head: Normocephalic and atraumatic.      Nose: No congestion/rhinnorhea.      Mouth/Throat: Mucous membranes are moist.      Neck: No stridor. Cardiovascular: Normal rate, regular rhythm. No murmurs, rubs, or gallops. Respiratory: Normal respiratory effort without tachypnea nor retractions. Breath sounds are clear and equal bilaterally. No wheezes/rales/rhonchi. Gastrointestinal: Soft and nontender. Normal bowel sounds Musculoskeletal: Nontender with normal range of motion in extremities. No lower extremity tenderness nor edema. Neurologic:  Normal speech and language. No gross focal neurologic deficits are appreciated.  Skin:  Skin is warm, dry and intact. No rash noted. Psychiatric: Mood and affect are normal. Speech and behavior are normal.  ____________________________________________  EKG: Interpreted by me.  Dennis tachycardia with a rate of 102 bpm, normal PR interval, normal QRS, normal QT  ____________________________________________  ED COURSE:  As part of my medical decision making, I reviewed the following data within the electronic MEDICAL RECORD NUMBER History obtained from  family if available, nursing notes, old chart and ekg, as well as notes from prior ED visits. Patient presented for palpitations, we will assess with labs and imaging as indicated at this time.   Procedures  Allison Bautista was evaluated in Emergency Department on 01/08/2019 for the symptoms described in the history of present illness. She was evaluated in the context of the global COVID-19 pandemic, which necessitated consideration  that the patient might be at risk for infection with the SARS-CoV-2 virus that causes COVID-19. Institutional protocols and algorithms that pertain to the evaluation of patients at risk for COVID-19 are in a state of rapid change based on information released by regulatory bodies including the CDC and federal and state organizations. These policies and algorithms were followed during the patient's care in the ED.  ____________________________________________   LABS (pertinent positives/negatives)  Labs Reviewed  BASIC METABOLIC PANEL  CBC  TROPONIN I  TSH    RADIOLOGY  Chest x-ray Is unremarkable ____________________________________________   DIFFERENTIAL DIAGNOSIS   Anxiety, arrhythmia, MI  FINAL ASSESSMENT AND PLAN  Palpitations   Plan: The patient had presented for palpitations. Patient's labs are reassuring. Patient's imaging not reveal any acute process.  Most likely these are anxiety related.  She is cleared for outpatient follow-up.   Laurence Aly, MD    Note: This note was generated in part or whole with voice recognition software. Voice recognition is usually quite accurate but there are transcription errors that can and very often do occur. I apologize for any typographical errors that were not detected and corrected.     Earleen Newport, MD 01/08/19 1159

## 2019-01-08 NOTE — ED Triage Notes (Signed)
Patient presents to the ED after an episode of palpitations last night.  Patient states she has had multiples of feeling like her heart feels, "cold".  Patient reports history of anxiety and states she started taking lexapro 3 weeks ago.  Patient states she has felt very tired.  Patient states she googled her symptoms and is concerned she may have heart failure.  Patient reports history of heart disease in her family.  Paternal grandfather died of a heart attack younger than 70.

## 2019-03-15 ENCOUNTER — Emergency Department: Payer: Medicaid Other

## 2019-03-15 ENCOUNTER — Encounter: Payer: Self-pay | Admitting: Emergency Medicine

## 2019-03-15 ENCOUNTER — Other Ambulatory Visit: Payer: Self-pay

## 2019-03-15 ENCOUNTER — Emergency Department
Admission: EM | Admit: 2019-03-15 | Discharge: 2019-03-15 | Disposition: A | Discharge status: Home / Self Care | Payer: Medicaid Other | Attending: Emergency Medicine | Admitting: Emergency Medicine

## 2019-03-15 DIAGNOSIS — F17210 Nicotine dependence, cigarettes, uncomplicated: Secondary | ICD-10-CM | POA: Diagnosis not present

## 2019-03-15 DIAGNOSIS — Z79899 Other long term (current) drug therapy: Secondary | ICD-10-CM | POA: Insufficient documentation

## 2019-03-15 DIAGNOSIS — R002 Palpitations: Secondary | ICD-10-CM | POA: Insufficient documentation

## 2019-03-15 LAB — BASIC METABOLIC PANEL
Anion gap: 8 (ref 5–15)
BUN: 11 mg/dL (ref 6–20)
CO2: 23 mmol/L (ref 22–32)
Calcium: 9.6 mg/dL (ref 8.9–10.3)
Chloride: 106 mmol/L (ref 98–111)
Creatinine, Ser: 0.59 mg/dL (ref 0.44–1.00)
GFR calc Af Amer: >60 mL/min (ref >60)
GFR calc non Af Amer: >60 mL/min (ref >60)
Glucose, Bld: 135 mg/dL — ABNORMAL HIGH (ref 70–99)
Potassium: 3.8 mmol/L (ref 3.5–5.1)
Sodium: 137 mmol/L (ref 135–145)

## 2019-03-15 LAB — TROPONIN I (HIGH SENSITIVITY): Troponin I (High Sensitivity): 3 ng/L (ref <18)

## 2019-03-15 LAB — CBC
HCT: 41.7 % (ref 36.0–46.0)
Hemoglobin: 14.1 g/dL (ref 12.0–15.0)
MCH: 31.4 pg (ref 26.0–34.0)
MCHC: 33.8 g/dL (ref 30.0–36.0)
MCV: 92.9 fL (ref 80.0–100.0)
Platelets: 256 10*3/uL (ref 150–400)
RBC: 4.49 MIL/uL (ref 3.87–5.11)
RDW: 12.7 % (ref 11.5–15.5)
WBC: 12.8 10*3/uL — ABNORMAL HIGH (ref 4.0–10.5)
nRBC: 0 % (ref 0.0–0.2)

## 2019-03-15 MED ORDER — SODIUM CHLORIDE 0.9% FLUSH: 3.0000 mL | Freq: Once | INTRAVENOUS | Status: DC

## 2019-03-15 NOTE — ED Triage Notes (Signed)
Pt presents to ED via POV with c/o palpitations x 4 days. Pt states has been evaluated for same before and was told her heart was fine. Pt states has been worsening over the last few days. Pt states feels like her heart skips beats and feels like she is going to pass out. Pt states intermittent sharp pain to L side of her chest.

## 2019-03-15 NOTE — ED Provider Notes (Signed)
[facility] Emergency Department Provider Note   ____________________________________________    I have reviewed the triage vital signs and the nursing notes.   HISTORY  Chief Complaint Palpitations     HPI Allison Bautista is a 33 y.o. female who presents with complaints of palpitations.  Patient describes a sensation of a skipped beat which happens fairly frequently.  Occasionally this is followed by a sensation of a rapid heart rate.  She has had this for some time and has been seen by cardiology had normal echocardiogram, normal 72-hour Holter monitoring.  Denies chest pain.  No shortness of breath.  No calf pain or swelling.  Currently is symptom-free.  Past Medical History:  Diagnosis Date  . BMI 30.0-30.9,adult   . Obesity affecting pregnancy in third trimester, antepartum     Patient Active Problem List   Diagnosis Date Noted  . Labor and delivery, indication for care 07/07/2015  . Obesity affecting pregnancy in third trimester, antepartum   . BMI 30.0-30.9,adult     Past Surgical History:  Procedure Laterality Date  . NO PAST SURGERIES      Prior to Admission medications   Medication Sig Start Date End Date Taking? Authorizing Provider  ALPRAZolam (XANAX) 0.25 MG tablet Take 1 tablet (0.25 mg total) by mouth 3 (three) times daily as needed for anxiety. 01/08/19 01/08/20  Earleen Newport, MD  cetirizine (ZYRTEC) 10 MG tablet Take 1 tablet (10 mg total) by mouth daily. If needed at night for itching not relieved by Claritin in the morning. 01/28/17   Frederich Cha, MD  loratadine (CLARITIN) 10 MG tablet Take 1 tablet (10 mg total) by mouth daily. Take 1 tablet in the morning. As needed for itching. 01/28/17   Frederich Cha, MD  mupirocin ointment (BACTROBAN) 2 % Apply 1 application topically 2 (two) times daily. 01/28/17   Frederich Cha, MD  predniSONE (STERAPRED UNI-PAK 21 TAB) 10 MG (21) TBPK tablet Dispense one 6 day pack. Take as  directed with food. 03/04/17   Melynda Ripple, MD  ranitidine (ZANTAC) 150 MG capsule Take 1 capsule (150 mg total) by mouth 2 (two) times daily. 01/28/17   Frederich Cha, MD     Allergies Doxycycline  No family history on file.  Social History Social History   Tobacco Use  . Smoking status: Current Every Day Smoker    Packs/day: 0.50    Types: Cigarettes  . Smokeless tobacco: Never Used  Substance Use Topics  . Alcohol use: No  . Drug use: No    Review of Systems  Constitutional: No fever/chills Eyes: No visual changes.  ENT: No neck swelling Cardiovascular: As above Respiratory: Denies shortness of breath. Gastrointestinal: No abdominal pain.  No nausea, no vomiting.   Genitourinary: Negative for dysuria. Musculoskeletal: Negative for back pain. Skin: Negative for rash. Neurological: Negative for headaches or weakness   ____________________________________________   PHYSICAL EXAM:  VITAL SIGNS: ED Triage Vitals [03/15/19 1247]  Enc Vitals Group     BP 124/82     Pulse Rate 98     Resp 18     Temp 99 F (37.2 C)     Temp Source Oral     SpO2 99 %     Weight 67.1 kg (148 lb)     Height 1.626 m (5\' 4" )     Head Circumference      Peak Flow      Pain Score 0     Pain Loc  Pain Edu?      Excl. in GC?    Constitutional: Alert and oriented. No acute distress. Pleasant and interactive   Nose: No congestion/rhinnorhea. Mouth/Throat: Mucous membranes are moist.   Neck: No neck swelling Cardiovascular: Normal rate, regular rhythm. Grossly normal heart sounds.  Good peripheral circulation. Respiratory: Normal respiratory effort.  No retractions.  Gastrointestinal: Soft and nontender. No distention.  Genitourinary: deferred Musculoskeletal: No lower extremity tenderness nor edema.  Warm and well perfused Neurologic:  Normal speech and language. No gross focal neurologic deficits are appreciated.  Skin:  Skin is warm, dry and intact. No rash noted.  Psychiatric: Mood and affect are normal. Speech and behavior are normal.  ____________________________________________   LABS (all labs ordered are listed, but only abnormal results are displayed)  Labs Reviewed  BASIC METABOLIC PANEL - Abnormal; Notable for the following components:      Result Value   Glucose, Bld 135 (*)    All other components within normal limits  CBC - Abnormal; Notable for the following components:   WBC 12.8 (*)    All other components within normal limits  POC URINE PREG, ED  TROPONIN I (HIGH SENSITIVITY)  TROPONIN I (HIGH SENSITIVITY)   ____________________________________________  EKG  ED ECG REPORT I, Jene Everyobert Latoi Giraldo, the attending physician, personally viewed and interpreted this ECG.  Date: 03/15/2019  Rhythm: Sinus tachycardia QRS Axis: normal Intervals: normal ST/T Wave abnormalities: normal Narrative Interpretation: no evidence of acute ischemia  ____________________________________________  RADIOLOGY  X-ray normal ____________________________________________   PROCEDURES  Procedure(s) performed: No  Procedures   Critical Care performed: No ____________________________________________   INITIAL IMPRESSION / ASSESSMENT AND PLAN / ED COURSE  Pertinent labs & imaging results that were available during my care of the patient were reviewed by me and considered in my medical decision making (see chart for details).  Patient well-appearing with reassuring exam.  Suspicious for PVCs versus SVT given description however normal 72-hour Holter monitoring although she notes that she did not have the rapid heart rate during that time.  Lab work here is unremarkable.  EKG is normal.  She is asymptomatic at this time.  Discussed low-dose beta-blocker versus repeat follow-up with cardiology and she is opted for follow-up.  No indication for admission at this time.  Counseled smoking cessation    ____________________________________________    FINAL CLINICAL IMPRESSION(S) / ED DIAGNOSES  Final diagnoses:  Palpitations        Note:  This document was prepared using Dragon voice recognition software and may include unintentional dictation errors.   Jene EveryKinner, Temperence Zenor, MD 03/15/19 1451

## 2019-03-15 NOTE — ED Notes (Signed)
Esign not working at this time. Pt's record locked and unable to get pt to sign discharge. Pt verbalizes discharge instructions and has no questions at this time

## 2019-04-21 ENCOUNTER — Encounter: Payer: Self-pay | Admitting: Emergency Medicine

## 2019-04-21 ENCOUNTER — Other Ambulatory Visit: Payer: Self-pay

## 2019-04-21 ENCOUNTER — Ambulatory Visit
Admission: EM | Admit: 2019-04-21 | Discharge: 2019-04-21 | Disposition: A | Discharge status: Home / Self Care | Payer: Medicaid Other | Attending: Internal Medicine | Admitting: Internal Medicine

## 2019-04-21 DIAGNOSIS — R42 Dizziness and giddiness: Secondary | ICD-10-CM | POA: Diagnosis not present

## 2019-04-21 DIAGNOSIS — R110 Nausea: Secondary | ICD-10-CM | POA: Diagnosis not present

## 2019-04-21 DIAGNOSIS — R0789 Other chest pain: Secondary | ICD-10-CM | POA: Insufficient documentation

## 2019-04-21 DIAGNOSIS — R002 Palpitations: Secondary | ICD-10-CM | POA: Diagnosis not present

## 2019-04-21 LAB — TSH: TSH: 2.179 u[IU]/mL (ref 0.350–4.500)

## 2019-04-21 NOTE — ED Provider Notes (Signed)
[facility]    CSN: [id number] Arrival date & time: 04/21/19  1241      History   Chief Complaint Chief Complaint  Patient presents with  . Chest Pain    HPI Allison Bautista is a 33 y.o. female with a history of recurrent palpitations comes to urgent care with complaints of palpitations and left-sided chest pain that happened yesterday.  Palpitations started suddenly and was associated with left-sided chest pain.  Chest pain was moderate severity.  It was reproducible.  It was aggravated by palpation.  She took ibuprofen with no improvement.  Patient continues to also have nausea vomiting and chalky-colored stools.  Patient has had Holter monitoring both a 3-day in the 14-day and she has been diagnosed with intermittent sinus tachycardia with no clear etiology.  She has some dizziness and sometimes feels like she is going to pass out.  No numbness or tingling.  Patient also follows with gastroenterology and has had fluoroscopic studies ruling out gastroesophageal reflux disease.  She would like to get evaluation to assess her for gallbladder disease.  HPI  Past Medical History:  Diagnosis Date  . BMI 30.0-30.9,adult   . Obesity affecting pregnancy in third trimester, antepartum     Patient Active Problem List   Diagnosis Date Noted  . Labor and delivery, indication for care 07/07/2015  . Obesity affecting pregnancy in third trimester, antepartum   . BMI 30.0-30.9,adult     Past Surgical History:  Procedure Laterality Date  . NO PAST SURGERIES      OB History    Gravida  2   Para  2   Term  2   Preterm      AB      Living  2     SAB      TAB      Ectopic      Multiple  0   Live Births  2            Home Medications    Prior to Admission medications   Medication Sig Start Date End Date Taking? Authorizing Provider  ALPRAZolam (XANAX) 0.25 MG tablet Take 1 tablet (0.25 mg total) by mouth 3 (three) times daily as needed for anxiety.  01/08/19 01/08/20 Yes Emily Filbert, MD  famotidine (PEPCID) 20 MG tablet Take 20 mg by mouth 2 (two) times daily.   Yes [provider]  metoprolol succinate (TOPROL-XL) 25 MG 24 hr tablet Take by mouth. 04/09/19 04/08/20 Yes [provider]  cetirizine (ZYRTEC) 10 MG tablet Take 1 tablet (10 mg total) by mouth daily. If needed at night for itching not relieved by Claritin in the morning. 01/28/17   Hassan Rowan, MD  loratadine (CLARITIN) 10 MG tablet Take 1 tablet (10 mg total) by mouth daily. Take 1 tablet in the morning. As needed for itching. 01/28/17   Hassan Rowan, MD  mupirocin ointment (BACTROBAN) 2 % Apply 1 application topically 2 (two) times daily. 01/28/17   Hassan Rowan, MD  predniSONE (STERAPRED UNI-PAK 21 TAB) 10 MG (21) TBPK tablet Dispense one 6 day pack. Take as directed with food. 03/04/17   Domenick Gong, MD  ranitidine (ZANTAC) 150 MG capsule Take 1 capsule (150 mg total) by mouth 2 (two) times daily. 01/28/17   Hassan Rowan, MD    Family History Family History  Problem Relation Age of Onset  . Depression Mother   . Hypertension Mother   . Hyperlipidemia Father   . Hypertension  Father   . Heart disease Father     Social History Social History   Tobacco Use  . Smoking status: Current Every Day Smoker    Packs/day: 0.50    Types: Cigarettes  . Smokeless tobacco: Never Used  Substance Use Topics  . Alcohol use: No  . Drug use: No     Allergies   Doxycycline   Review of Systems Review of Systems  Constitutional: Negative for activity change, chills, fatigue and fever.  HENT: Negative.   Eyes: Negative.   Respiratory: Negative.  Negative for chest tightness.   Cardiovascular: Positive for chest pain and palpitations. Negative for leg swelling.  Gastrointestinal: Positive for abdominal pain and nausea. Negative for blood in stool, constipation, diarrhea and vomiting.  Endocrine: Negative.   Genitourinary: Negative for dysuria, frequency  and urgency.  Musculoskeletal: Negative.   Skin: Negative.   Neurological: Positive for dizziness and light-headedness. Negative for syncope, weakness and headaches.  Psychiatric/Behavioral: Negative.      Physical Exam Triage Vital Signs ED Triage Vitals  Enc Vitals Group     BP 04/21/19 1255 123/88     Pulse Rate 04/21/19 1255 91     Resp 04/21/19 1255 18     Temp 04/21/19 1255 98 F (36.7 C)     Temp Source 04/21/19 1255 Oral     SpO2 04/21/19 1255 100 %     Weight 04/21/19 1252 143 lb (64.9 kg)     Height 04/21/19 1252 5\' 4"  (1.626 m)     Head Circumference --      Peak Flow --      Pain Score 04/21/19 1251 3     Pain Loc --      Pain Edu? --      Excl. in [place]? --    No data found.  Updated Vital Signs BP 123/88 (BP Location: Left Arm)   Pulse 91   Temp 98 F (36.7 C) (Oral)   Resp 18   Ht 5\' 4"  (1.626 m)   Wt 64.9 kg   LMP 04/07/2019 (Exact Date)   SpO2 100%   BMI 24.55 kg/m   Visual Acuity Right Eye Distance:   Left Eye Distance:   Bilateral Distance:    Right Eye Near:   Left Eye Near:    Bilateral Near:     Physical Exam Vitals signs and nursing note reviewed.  Constitutional:      Appearance: She is not ill-appearing or toxic-appearing.  Neck:     Musculoskeletal: Normal range of motion and neck supple.  Cardiovascular:     Rate and Rhythm: Normal rate and regular rhythm.     Heart sounds: Normal heart sounds. Heart sounds not distant. No murmur. No diastolic murmur.  Pulmonary:     Effort: Pulmonary effort is normal. No tachypnea or accessory muscle usage.     Breath sounds: No decreased breath sounds, wheezing or rhonchi.  Chest:     Chest wall: Tenderness present. No edema. There is no dullness to percussion.  Abdominal:     General: Bowel sounds are normal.     Palpations: Abdomen is soft.  Skin:    General: Skin is warm.  Neurological:     General: No focal deficit present.     Mental Status: She is alert.      UC Treatments /  Results  Labs (all labs ordered are listed, but only abnormal results are displayed) Labs Reviewed  VITAMIN D 25 HYDROXY (VIT D DEFICIENCY,  FRACTURES)  TSH    EKG   Radiology No results found.  Procedures Procedures (including critical care time)  Medications Ordered in UC Medications - No data to display  Initial Impression / Assessment and Plan / UC Course  I have reviewed the triage vital signs and the nursing notes.  Pertinent labs & imaging results that were available during my care of the patient were reviewed by me and considered in my medical decision making (see chart for details).     1.  Chest wall tenderness: Warm compress Tylenol as needed for chest pain Chest pain is musculoskeletal. If patient has persistent or frequent recurrent palpitations she might need cardiology to reevaluate for medical management.  Patient will need follow-up in the cardiology office for further recommendations.  2.  Gastroesophageal reflux disease, persistent: Patient will need to follow-up with the gastroenterology team for further evaluation pertaining to gallbladder disease.  I suspect that the patient will need abdominal ultrasound, HIDA scan etc.  Patient is advised to continue current PPI regimen. Final Clinical Impressions(s) / UC Diagnoses   Final diagnoses:  None   Discharge Instructions   None    ED Prescriptions    None     PDMP not reviewed this encounter.   Merrilee Jansky, MD 04/21/19 [phone]

## 2019-04-21 NOTE — ED Triage Notes (Signed)
Pt c/o chest pain on her left side. She states that her heart was beating really fast and she could not count her pulse. She has been having a lot of heart issues and has seen her cardiologist. She has also been told by several of her nurse friends that she should possible have her gallbladder checked due to having gas, bloating after eating. The chest pain started last night and radiates up her neck. She states that she has had shortness of breath, and nausea.

## 2019-04-22 LAB — VITAMIN D 25 HYDROXY (VIT D DEFICIENCY, FRACTURES): Vit D, 25-Hydroxy: 25.2 ng/mL — ABNORMAL LOW (ref 30.0–100.0)

## 2019-04-23 ENCOUNTER — Encounter (HOSPITAL_COMMUNITY): Payer: Self-pay

## 2019-04-23 ENCOUNTER — Telehealth (HOSPITAL_COMMUNITY): Payer: Self-pay | Admitting: Emergency Medicine

## 2019-04-23 NOTE — Telephone Encounter (Signed)
Thyroid normal, Vit D low, Per Traci Please take 600-800iu of Vitamin D3 over the counter, recheck in a few months with primary care provider.  Attempted to reach patient. No answer at this time. Voicemail left.

## 2019-04-23 NOTE — Telephone Encounter (Signed)
Patient contacted and made aware of  lab  results, all questions answered   

## 2020-07-05 ENCOUNTER — Other Ambulatory Visit: Payer: Self-pay

## 2020-07-05 ENCOUNTER — Ambulatory Visit
Admission: EM | Admit: 2020-07-05 | Discharge: 2020-07-05 | Disposition: A | Discharge status: Home / Self Care | Payer: Medicaid Other | Attending: Family Medicine | Admitting: Family Medicine

## 2020-07-05 DIAGNOSIS — N390 Urinary tract infection, site not specified: Secondary | ICD-10-CM | POA: Diagnosis not present

## 2020-07-05 LAB — URINALYSIS, COMPLETE (UACMP) WITH MICROSCOPIC
Bilirubin Urine: NEGATIVE
Glucose, UA: NEGATIVE mg/dL
Ketones, ur: NEGATIVE mg/dL
Nitrite: NEGATIVE
Protein, ur: NEGATIVE mg/dL
Specific Gravity, Urine: 1.02 (ref 1.005–1.030)
pH: 7.5 (ref 5.0–8.0)

## 2020-07-05 MED ORDER — PHENAZOPYRIDINE HCL 200 MG PO TABS: 200.0000 mg | ORAL_TABLET | Freq: Three times a day (TID) | ORAL | 0 refills | Status: DC

## 2020-07-05 MED ORDER — CEPHALEXIN 500 MG PO CAPS: 500.0000 mg | ORAL_CAPSULE | Freq: Two times a day (BID) | ORAL | 0 refills | Status: AC

## 2020-07-05 MED ORDER — FLUCONAZOLE 200 MG PO TABS: 200.0000 mg | ORAL_TABLET | Freq: Every day | ORAL | 0 refills | Status: AC

## 2020-07-05 NOTE — Discharge Instructions (Addendum)
Take the Keflex twice daily for 5 days.  Use the Pyridium every 8 hours as needed for urinary discomfort.  If you develop symptoms of yeast infection take 1 Diflucan tablet now and you may repeat it in a week if your symptoms have not improved.  Increase your oral fluid intake to increase your urine production and flush your urinary system.  We will culture your urine and if we need to change antibiotics based on the results we will.  If your symptoms continue or worsen either return for reevaluation or follow-up with your primary care physician.

## 2020-07-05 NOTE — ED Provider Notes (Signed)
[facility]    CSN: [id number] Arrival date & time: 07/05/20  1212      History   Chief Complaint Chief Complaint  Patient presents with  . Dysuria    HPI Allison Bautista is a 34 y.o. female.   HPI   34 year old female here for evaluation of painful urination that has been off and on for the past 3 days.  Patient reports that she has had some cloudiness to her urine and also some vaginal burning.  Patient denies vaginal discharge, urgency or frequency of urination, vaginal itching, blood in urine, abdomen or back pain, or fever.  Past Medical History:  Diagnosis Date  . BMI 30.0-30.9,adult   . Obesity affecting pregnancy in third trimester, antepartum     Patient Active Problem List   Diagnosis Date Noted  . Labor and delivery, indication for care 07/07/2015  . Obesity affecting pregnancy in third trimester, antepartum   . BMI 30.0-30.9,adult     Past Surgical History:  Procedure Laterality Date  . NO PAST SURGERIES      OB History    Gravida  2   Para  2   Term  2   Preterm      AB      Living  2     SAB      TAB      Ectopic      Multiple  0   Live Births  2            Home Medications    Prior to Admission medications   Medication Sig Start Date End Date Taking? Authorizing Provider  desvenlafaxine (PRISTIQ) 50 MG 24 hr tablet Take 50 mg by mouth daily. 06/24/20  Yes [provider]  famotidine (PEPCID) 20 MG tablet Take 20 mg by mouth 2 (two) times daily.   Yes [provider]  metoprolol succinate (TOPROL-XL) 25 MG 24 hr tablet Take by mouth. 04/09/19 07/05/20 Yes [provider]  cephALEXin (KEFLEX) 500 MG capsule Take 1 capsule (500 mg total) by mouth 2 (two) times daily for 5 days. 07/05/20 07/10/20  Becky Augusta, NP  fluconazole (DIFLUCAN) 200 MG tablet Take 1 tablet (200 mg total) by mouth daily for 7 days. 07/05/20 07/12/20  Becky Augusta, NP  phenazopyridine (PYRIDIUM) 200 MG tablet Take 1  tablet (200 mg total) by mouth 3 (three) times daily. 07/05/20   Becky Augusta, NP  cetirizine (ZYRTEC) 10 MG tablet Take 1 tablet (10 mg total) by mouth daily. If needed at night for itching not relieved by Claritin in the morning. 01/28/17 04/21/19  Hassan Rowan, MD  loratadine (CLARITIN) 10 MG tablet Take 1 tablet (10 mg total) by mouth daily. Take 1 tablet in the morning. As needed for itching. 01/28/17 04/21/19  Hassan Rowan, MD  ranitidine (ZANTAC) 150 MG capsule Take 1 capsule (150 mg total) by mouth 2 (two) times daily. 01/28/17 04/21/19  Hassan Rowan, MD    Family History Family History  Problem Relation Age of Onset  . Depression Mother   . Hypertension Mother   . Hyperlipidemia Father   . Hypertension Father   . Heart disease Father     Social History Social History   Tobacco Use  . Smoking status: Current Every Day Smoker    Packs/day: 0.50    Types: Cigarettes  . Smokeless tobacco: Never Used  Vaping Use  . Vaping Use: Never used  Substance Use Topics  . Alcohol use: No  .  Drug use: No     Allergies   Doxycycline   Review of Systems Review of Systems  Constitutional: Negative for activity change, appetite change and fever.  Gastrointestinal: Negative for abdominal pain, nausea and vomiting.  Genitourinary: Positive for dysuria and vaginal pain. Negative for frequency, hematuria, urgency and vaginal discharge.  Musculoskeletal: Negative for back pain.  Skin: Negative for rash.  Hematological: Negative.   Psychiatric/Behavioral: Negative.      Physical Exam Triage Vital Signs ED Triage Vitals  Enc Vitals Group     BP 07/05/20 1306 110/77     Pulse Rate 07/05/20 1306 79     Resp 07/05/20 1306 14     Temp 07/05/20 1306 98.2 F (36.8 C)     Temp Source 07/05/20 1306 Oral     SpO2 07/05/20 1306 100 %     Weight 07/05/20 1304 175 lb (79.4 kg)     Height 07/05/20 1304 5\' 4"  (1.626 m)     Head Circumference --      Peak Flow --      Pain Score 07/05/20 1304 2      Pain Loc --      Pain Edu? --      Excl. in [place]? --    No data found.  Updated Vital Signs BP 110/77 (BP Location: Right Arm)   Pulse 79   Temp 98.2 F (36.8 C) (Oral)   Resp 14   Ht 5\' 4"  (1.626 m)   Wt 175 lb (79.4 kg)   LMP 06/20/2020   SpO2 100%   BMI 30.04 kg/m   Visual Acuity Right Eye Distance:   Left Eye Distance:   Bilateral Distance:    Right Eye Near:   Left Eye Near:    Bilateral Near:     Physical Exam Vitals and nursing note reviewed.  Constitutional:      General: She is not in acute distress.    Appearance: Normal appearance. She is normal weight. She is not toxic-appearing.  HENT:     Head: Normocephalic and atraumatic.  Cardiovascular:     Rate and Rhythm: Normal rate and regular rhythm.     Pulses: Normal pulses.     Heart sounds: Normal heart sounds. No murmur heard.  No gallop.   Pulmonary:     Effort: Pulmonary effort is normal.     Breath sounds: Normal breath sounds. No wheezing, rhonchi or rales.  Abdominal:     Tenderness: There is no right CVA tenderness or left CVA tenderness.  Musculoskeletal:        General: No swelling or tenderness. Normal range of motion.  Skin:    General: Skin is warm and dry.     Capillary Refill: Capillary refill takes less than 2 seconds.     Findings: No erythema or rash.  Neurological:     General: No focal deficit present.     Mental Status: She is alert and oriented to person, place, and time.  Psychiatric:        Mood and Affect: Mood normal.        Behavior: Behavior normal.        Thought Content: Thought content normal.        Judgment: Judgment normal.      UC Treatments / Results  Labs (all labs ordered are listed, but only abnormal results are displayed) Labs Reviewed  URINALYSIS, COMPLETE (UACMP) WITH MICROSCOPIC - Abnormal; Notable for the following components:  Result Value   APPearance CLOUDY (*)    Hgb urine dipstick TRACE (*)    Leukocytes,Ua TRACE (*)    Bacteria,  UA FEW (*)    All other components within normal limits  URINE CULTURE    EKG   Radiology No results found.  Procedures Procedures (including critical care time)  Medications Ordered in UC Medications - No data to display  Initial Impression / Assessment and Plan / UC Course  I have reviewed the triage vital signs and the nursing notes.  Pertinent labs & imaging results that were available during my care of the patient were reviewed by me and considered in my medical decision making (see chart for details).   Patient is here for evaluation of painful urination that she has had for the last 3 days.  Patient reports that initially she thought she might be developing a yeast infection because she had a little burning in her vaginal area but no discharge.  She developed painful urination at this afternoon inside, to be evaluated.  Patient has no CVA tenderness.  We will send UA for urinalysis.  UA shows trace hemoglobin, trace leukocytes no nitrites, 21-50 WBC's, no squamous epithelial, few bacteria, WBCs in clumps.  Will send urine for culture, treat with Keflex twice daily x5 days, give Pyridium for discomfort, and will also cover patient with Diflucan as she reports antibiotics to cause yeast infections.   Final Clinical Impressions(s) / UC Diagnoses   Final diagnoses:  Lower urinary tract infectious disease     Discharge Instructions     Take the Keflex twice daily for 5 days.  Use the Pyridium every 8 hours as needed for urinary discomfort.  If you develop symptoms of yeast infection take 1 Diflucan tablet now and you may repeat it in a week if your symptoms have not improved.  Increase your oral fluid intake to increase your urine production and flush your urinary system.  We will culture your urine and if we need to change antibiotics based on the results we will.  If your symptoms continue or worsen either return for reevaluation or follow-up with your primary care  physician.    ED Prescriptions    Medication Sig Dispense Auth. Provider   cephALEXin (KEFLEX) 500 MG capsule Take 1 capsule (500 mg total) by mouth 2 (two) times daily for 5 days. 10 capsule Becky Augusta, NP   fluconazole (DIFLUCAN) 200 MG tablet Take 1 tablet (200 mg total) by mouth daily for 7 days. 7 tablet Becky Augusta, NP   phenazopyridine (PYRIDIUM) 200 MG tablet Take 1 tablet (200 mg total) by mouth 3 (three) times daily. 6 tablet Becky Augusta, NP     PDMP not reviewed this encounter.   Becky Augusta, NP 07/05/20 1336

## 2020-07-05 NOTE — ED Triage Notes (Signed)
Patient states that she started noticing some discomfort with urination x 3 days. States that waxes and wanes with pain.

## 2020-07-08 LAB — URINE CULTURE
Culture: >=100000 — AB
Special Requests: NORMAL

## 2020-07-10 ENCOUNTER — Telehealth (HOSPITAL_COMMUNITY): Payer: Self-pay | Admitting: Emergency Medicine

## 2020-07-10 MED ORDER — SULFAMETHOXAZOLE-TRIMETHOPRIM 800-160 MG PO TABS: 1.0000 | ORAL_TABLET | Freq: Two times a day (BID) | ORAL | 0 refills | Status: AC

## 2020-09-05 ENCOUNTER — Other Ambulatory Visit: Payer: Self-pay

## 2020-09-05 ENCOUNTER — Ambulatory Visit
Admission: EM | Admit: 2020-09-05 | Discharge: 2020-09-05 | Disposition: A | Discharge status: Home / Self Care | Payer: Medicaid Other | Attending: Sports Medicine | Admitting: Sports Medicine

## 2020-09-05 DIAGNOSIS — R350 Frequency of micturition: Secondary | ICD-10-CM

## 2020-09-05 DIAGNOSIS — R300 Dysuria: Secondary | ICD-10-CM | POA: Diagnosis present

## 2020-09-05 DIAGNOSIS — N390 Urinary tract infection, site not specified: Secondary | ICD-10-CM | POA: Diagnosis not present

## 2020-09-05 LAB — URINALYSIS, COMPLETE (UACMP) WITH MICROSCOPIC
Bilirubin Urine: NEGATIVE
Glucose, UA: NEGATIVE mg/dL
Ketones, ur: NEGATIVE mg/dL
Nitrite: NEGATIVE
Protein, ur: NEGATIVE mg/dL
Specific Gravity, Urine: 1.02 (ref 1.005–1.030)
pH: 7 (ref 5.0–8.0)

## 2020-09-05 MED ORDER — NITROFURANTOIN MONOHYD MACRO 100 MG PO CAPS: 100.0000 mg | ORAL_CAPSULE | Freq: Two times a day (BID) | ORAL | 0 refills | Status: DC

## 2020-09-05 NOTE — ED Provider Notes (Signed)
[facility]    CSN: [id number] Arrival date & time: 09/05/20  1102      History   Chief Complaint Chief Complaint  Patient presents with  . Dysuria    HPI Allison Bautista is a 35 y.o. female.   Patient pleasant 35 year old female who presents for evaluation of the above issues.  She says she woke up this morning having pain on urination with burning. Increased frequency and incomplete voiding also noted. No hematuria. Last menstrual period was August 14, 2020. She denies abdominal pain. No nausea vomiting diarrhea, no fever shakes chills. This is her 2nd UTI in the past few months. She never had any UTIs up until recently since she was a teenager. No chest pain shortness of breath. No red flag signs or symptoms elicited on history.     Past Medical History:  Diagnosis Date  . BMI 30.0-30.9,adult   . Obesity affecting pregnancy in third trimester, antepartum     Patient Active Problem List   Diagnosis Date Noted  . Labor and delivery, indication for care 07/07/2015  . Obesity affecting pregnancy in third trimester, antepartum   . BMI 30.0-30.9,adult     Past Surgical History:  Procedure Laterality Date  . NO PAST SURGERIES      OB History    Gravida  2   Para  2   Term  2   Preterm      AB      Living  2     SAB      IAB      Ectopic      Multiple  0   Live Births  2            Home Medications    Prior to Admission medications   Medication Sig Start Date End Date Taking? Authorizing Provider  desvenlafaxine (PRISTIQ) 50 MG 24 hr tablet Take 50 mg by mouth daily. 06/24/20  Yes [provider]  famotidine (PEPCID) 20 MG tablet Take 20 mg by mouth 2 (two) times daily.   Yes [provider]  metoprolol succinate (TOPROL-XL) 25 MG 24 hr tablet Take by mouth. 04/09/19 09/05/20 Yes [provider]  nitrofurantoin, macrocrystal-monohydrate, (MACROBID) 100 MG capsule Take 1 capsule (100 mg total) by mouth 2  (two) times daily. 09/05/20  Yes Delton See, MD  phenazopyridine (PYRIDIUM) 200 MG tablet Take 1 tablet (200 mg total) by mouth 3 (three) times daily. 07/05/20   Becky Augusta, NP  cetirizine (ZYRTEC) 10 MG tablet Take 1 tablet (10 mg total) by mouth daily. If needed at night for itching not relieved by Claritin in the morning. 01/28/17 04/21/19  Hassan Rowan, MD  loratadine (CLARITIN) 10 MG tablet Take 1 tablet (10 mg total) by mouth daily. Take 1 tablet in the morning. As needed for itching. 01/28/17 04/21/19  Hassan Rowan, MD  ranitidine (ZANTAC) 150 MG capsule Take 1 capsule (150 mg total) by mouth 2 (two) times daily. 01/28/17 04/21/19  Hassan Rowan, MD    Family History Family History  Problem Relation Age of Onset  . Depression Mother   . Hypertension Mother   . Hyperlipidemia Father   . Hypertension Father   . Heart disease Father     Social History Social History   Tobacco Use  . Smoking status: Current Every Day Smoker    Packs/day: 0.50    Types: Cigarettes  . Smokeless tobacco: Never Used  Vaping Use  . Vaping Use: Never used  Substance Use Topics  . Alcohol use: No  . Drug use: No     Allergies   Doxycycline   Review of Systems Review of Systems  Constitutional: Negative for chills, diaphoresis, fatigue and fever.  HENT: Negative.   Eyes: Negative.   Respiratory: Negative.   Cardiovascular: Negative.   Gastrointestinal: Negative for abdominal pain, diarrhea, nausea and vomiting.  Genitourinary: Positive for dysuria, frequency and urgency. Negative for flank pain, hematuria, pelvic pain, vaginal bleeding, vaginal discharge and vaginal pain.  Musculoskeletal: Negative.  Negative for myalgias.  Skin: Negative.   Neurological: Negative.   All other systems reviewed and are negative.    Physical Exam Triage Vital Signs ED Triage Vitals  Enc Vitals Group     BP 09/05/20 1138 104/71     Pulse Rate 09/05/20 1138 83     Resp 09/05/20 1138 18     Temp 09/05/20  1138 98.2 F (36.8 C)     Temp Source 09/05/20 1138 Oral     SpO2 09/05/20 1138 100 %     Weight 09/05/20 1135 177 lb (80.3 kg)     Height 09/05/20 1135 5\' 4"  (1.626 m)     Head Circumference --      Peak Flow --      Pain Score 09/05/20 1135 1     Pain Loc --      Pain Edu? --      Excl. in GC? --    No data found.  Updated Vital Signs BP 104/71 (BP Location: Left Arm)   Pulse 83   Temp 98.2 F (36.8 C) (Oral)   Resp 18   Ht 5\' 4"  (1.626 m)   Wt 80.3 kg   LMP 08/14/2020 (Exact Date)   SpO2 100%   BMI 30.38 kg/m   Visual Acuity Right Eye Distance:   Left Eye Distance:   Bilateral Distance:    Right Eye Near:   Left Eye Near:    Bilateral Near:     Physical Exam Vitals and nursing note reviewed.  Constitutional:      General: She is not in acute distress.    Appearance: Normal appearance. She is not ill-appearing or toxic-appearing.  HENT:     Head: Normocephalic and atraumatic.  Cardiovascular:     Rate and Rhythm: Normal rate and regular rhythm.     Pulses: Normal pulses.     Heart sounds: Normal heart sounds.  Pulmonary:     Effort: Pulmonary effort is normal.     Breath sounds: Normal breath sounds.  Abdominal:     General: There is no distension.     Palpations: Abdomen is soft.     Tenderness: There is no abdominal tenderness. There is no right CVA tenderness, left CVA tenderness, guarding or rebound.  Skin:    General: Skin is warm and dry.     Capillary Refill: Capillary refill takes less than 2 seconds.  Neurological:     General: No focal deficit present.     Mental Status: She is alert and oriented to person, place, and time.      UC Treatments / Results  Labs (all labs ordered are listed, but only abnormal results are displayed) Labs Reviewed  URINALYSIS, COMPLETE (UACMP) WITH MICROSCOPIC - Abnormal; Notable for the following components:      Result Value   APPearance CLOUDY (*)    Hgb urine dipstick MODERATE (*)    Leukocytes,Ua  MODERATE (*)    Bacteria, UA FEW (*)  All other components within normal limits  URINE CULTURE    EKG   Radiology No results found.  Procedures Procedures (including critical care time)  Medications Ordered in UC Medications - No data to display  Initial Impression / Assessment and Plan / UC Course  I have reviewed the triage vital signs and the nursing notes.  Pertinent labs & imaging results that were available during my care of the patient were reviewed by me and considered in my medical decision making (see chart for details).  Clinical impression: Dysuria with increased frequency and incomplete voiding since this morning.  Treatment plan: 1. The findings and treatment plan were discussed in detail with the patient. Patient was in agreement. 2. We will get a urinalysis. Results are above. Does show leukocytes and bacteria. Will treat for UTI. We will send off the urine culture. 3. Prescribed Macrobid twice daily for 5 days. 4. Plenty of fluids, cranberry juice, flush her system, Azo for pain. 5. Educational handout was provided. 6. Went over the red flag signs and symptoms including fever, flank pain, nausea vomiting diarrhea, or lower abdominal pain and when to seek out immediate medical attention. She voiced verbal understanding. 7. Over-the-counter meds as needed for fever or discomfort. 8. Follow-up here as needed.    Final Clinical Impressions(s) / UC Diagnoses   Final diagnoses:  Dysuria  Lower urinary tract infectious disease  Urinary frequency     Discharge Instructions     Your urinalysis showed a urinary tract infection.  We will confirm with a urine culture. We will treat you with Macrobid for 5 days.  Please take it twice a day. Educational handout was provided. Please flush her system with plenty of fluids including cranberry juice.  You can use Azo for any discomfort you have.  I hope you get to feeling better, Dr. Zachery Dauer    ED  Prescriptions    Medication Sig Dispense Auth. Provider   nitrofurantoin, macrocrystal-monohydrate, (MACROBID) 100 MG capsule Take 1 capsule (100 mg total) by mouth 2 (two) times daily. 10 capsule Delton See, MD     PDMP not reviewed this encounter.   Delton See, MD 09/05/20 [phone]

## 2020-09-05 NOTE — Discharge Instructions (Addendum)
Your urinalysis showed a urinary tract infection.  We will confirm with a urine culture. We will treat you with Macrobid for 5 days.  Please take it twice a day. Educational handout was provided. Please flush her system with plenty of fluids including cranberry juice.  You can use Azo for any discomfort you have.  I hope you get to feeling better, Dr. Zachery Dauer

## 2020-09-05 NOTE — ED Triage Notes (Signed)
Pt c/o pain and burning with urination, starting early this morning. Pt denies abd pain, f/n/v/d or other symptoms. Pt reports she has had multiple UTI's in the past few months.

## 2020-09-07 ENCOUNTER — Telehealth (HOSPITAL_COMMUNITY): Payer: Self-pay | Admitting: Emergency Medicine

## 2020-09-07 LAB — URINE CULTURE
Culture: 20000 — AB
Special Requests: NORMAL

## 2020-09-07 MED ORDER — SULFAMETHOXAZOLE-TRIMETHOPRIM 800-160 MG PO TABS: 1.0000 | ORAL_TABLET | Freq: Two times a day (BID) | ORAL | 0 refills | Status: AC

## 2020-09-20 IMAGING — CR CHEST - 2 VIEW
1 series · 2 of 2 positions shown · non-contrast
Comparison: None.

CLINICAL DATA: Palpitations, chest pain

EXAM:
CHEST - 2 VIEW

[Series 1: dg chest 2 view · 0.14mm/px · 2 of 2 slices shown]
[im 1/2]
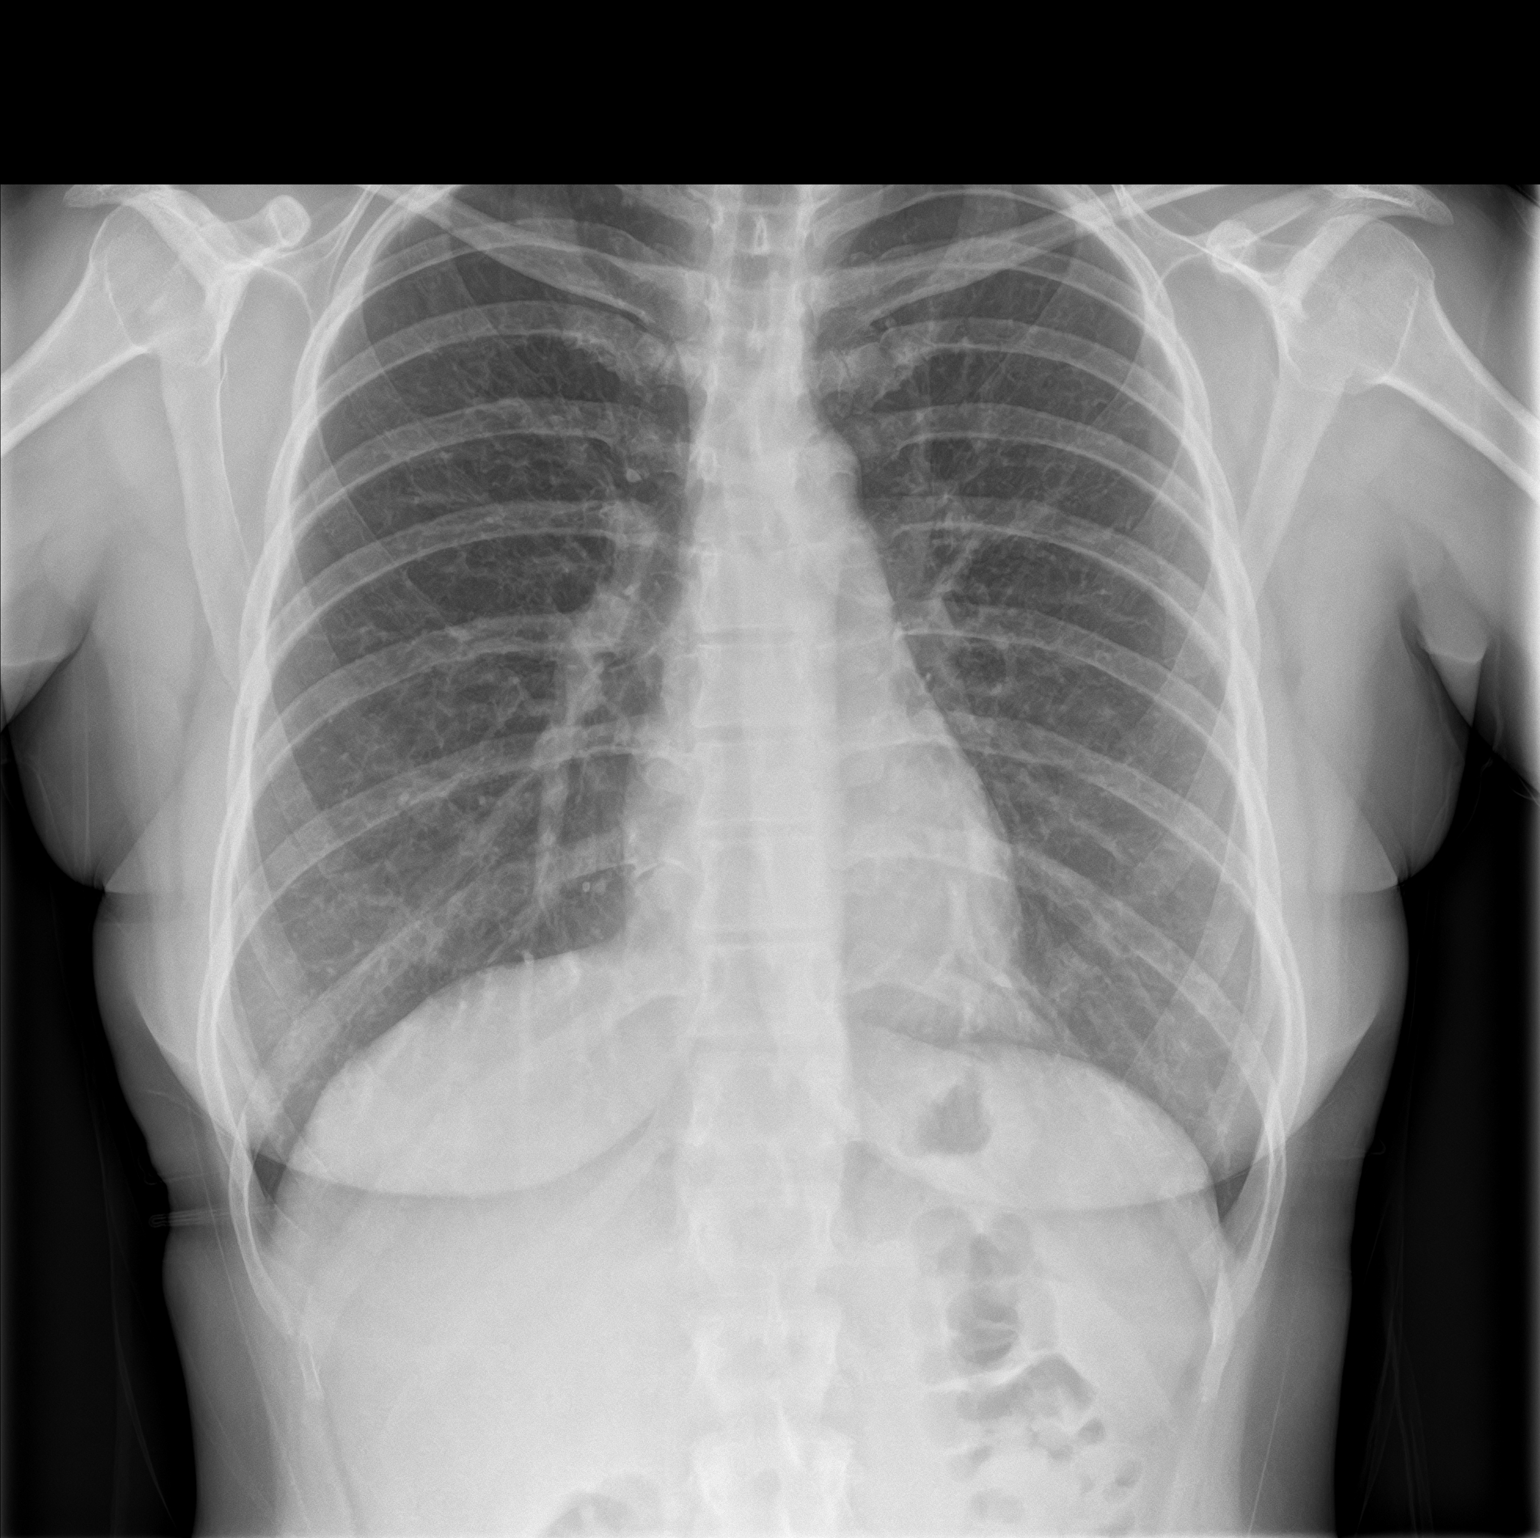
[im 2/2]
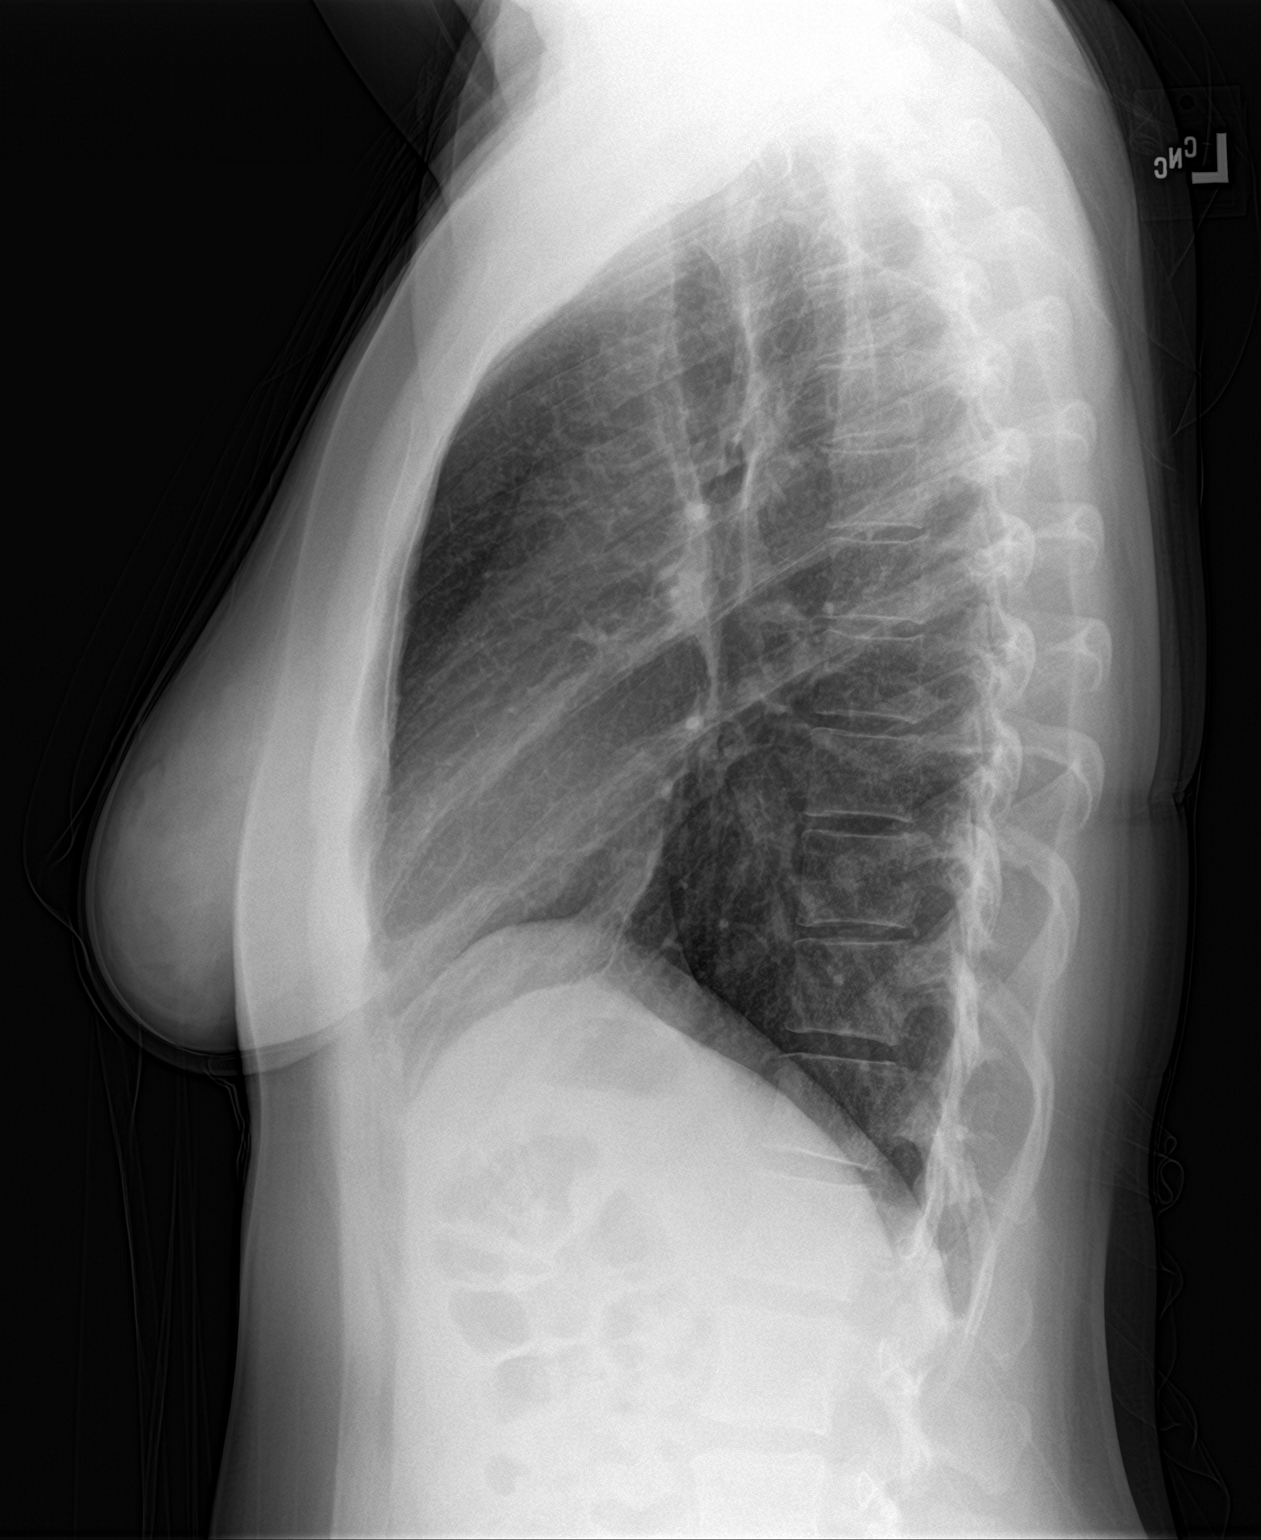

[2 of 2 positions shown; findings below may reference images not displayed]

FINDINGS: Heart and mediastinal contours are within normal limits. No focal
opacities or effusions. No acute bony abnormality.
IMPRESSION: No active cardiopulmonary disease.

## 2020-10-20 ENCOUNTER — Ambulatory Visit
Admission: EM | Admit: 2020-10-20 | Discharge: 2020-10-20 | Disposition: A | Discharge status: Home / Self Care | Payer: Medicaid Other | Attending: Family Medicine | Admitting: Family Medicine

## 2020-10-20 ENCOUNTER — Other Ambulatory Visit: Payer: Self-pay

## 2020-10-20 ENCOUNTER — Encounter: Payer: Self-pay | Admitting: Emergency Medicine

## 2020-10-20 DIAGNOSIS — K219 Gastro-esophageal reflux disease without esophagitis: Secondary | ICD-10-CM

## 2020-10-20 MED ORDER — PANTOPRAZOLE SODIUM 40 MG PO TBEC: 40.0000 mg | DELAYED_RELEASE_TABLET | Freq: Every day | ORAL | 1 refills | Status: AC

## 2020-10-20 NOTE — ED Provider Notes (Signed)
[facility]    CSN: [id number] Arrival date & time: 10/20/20  1318      History   Chief Complaint Chief Complaint  Patient presents with  . Heartburn   HPI  35 year old female presents with the above complaint.  Patient reports that she has had issues with reflux.  She takes Pepcid 20 mg daily as needed.  She states that lately it has been worsening.  Has been worsening over the past week.  She reports burning and some bloating as well.  She states that sometimes she feels like her throat is tightening up and that she cannot breathe.  Reports nausea but no vomiting.  She reports associated epigastric discomfort.  She has had an ongoing cough as well.  She notices that certain foods seem to trigger her symptoms.  She rates her discomfort as 2/10 in severity.  She states that now her symptoms are not really responding to Pepcid which got her concerned.  No other complaints.  Past Medical History:  Diagnosis Date  . BMI 30.0-30.9,adult   . Obesity affecting pregnancy in third trimester, antepartum    Patient Active Problem List   Diagnosis Date Noted  . Labor and delivery, indication for care 07/07/2015  . Obesity affecting pregnancy in third trimester, antepartum   . BMI 30.0-30.9,adult    Past Surgical History:  Procedure Laterality Date  . NO PAST SURGERIES     OB History    Gravida  2   Para  2   Term  2   Preterm      AB      Living  2     SAB      IAB      Ectopic      Multiple  0   Live Births  2            Home Medications    Prior to Admission medications   Medication Sig Start Date End Date Taking? Authorizing Provider  desvenlafaxine (PRISTIQ) 50 MG 24 hr tablet Take 50 mg by mouth daily. 06/24/20  Yes [provider]  famotidine (PEPCID) 20 MG tablet Take 20 mg by mouth 2 (two) times daily.   Yes [provider]  metoprolol succinate (TOPROL-XL) 25 MG 24 hr tablet Take by mouth. 04/09/19 10/20/20 Yes  [provider]  pantoprazole (PROTONIX) 40 MG tablet Take 1 tablet (40 mg total) by mouth daily. 10/20/20  Yes Sholom Dulude G, DO  cetirizine (ZYRTEC) 10 MG tablet Take 1 tablet (10 mg total) by mouth daily. If needed at night for itching not relieved by Claritin in the morning. 01/28/17 04/21/19  Hassan Rowan, MD  loratadine (CLARITIN) 10 MG tablet Take 1 tablet (10 mg total) by mouth daily. Take 1 tablet in the morning. As needed for itching. 01/28/17 04/21/19  Hassan Rowan, MD  ranitidine (ZANTAC) 150 MG capsule Take 1 capsule (150 mg total) by mouth 2 (two) times daily. 01/28/17 04/21/19  Hassan Rowan, MD    Family History Family History  Problem Relation Age of Onset  . Depression Mother   . Hypertension Mother   . Hyperlipidemia Father   . Hypertension Father   . Heart disease Father     Social History Social History   Tobacco Use  . Smoking status: Current Every Day Smoker    Packs/day: 0.50    Types: Cigarettes  . Smokeless tobacco: Never Used  Vaping Use  . Vaping Use: Never used  Substance  Use Topics  . Alcohol use: No  . Drug use: No     Allergies   Doxycycline   Review of Systems Review of Systems  Constitutional: Negative.   Respiratory: Positive for cough.   Gastrointestinal: Positive for abdominal pain.       GERD.   Physical Exam Triage Vital Signs ED Triage Vitals  Enc Vitals Group     BP 10/20/20 1411 116/76     Pulse Rate 10/20/20 1411 85     Resp 10/20/20 1411 14     Temp 10/20/20 1411 98.3 F (36.8 C)     Temp Source 10/20/20 1411 Oral     SpO2 10/20/20 1411 99 %     Weight 10/20/20 1407 176 lb (79.8 kg)     Height 10/20/20 1407 5\' 4"  (1.626 m)     Head Circumference --      Peak Flow --      Pain Score 10/20/20 1407 2     Pain Loc --      Pain Edu? --      Excl. in GC? --    Updated Vital Signs BP 116/76 (BP Location: Right Arm)   Pulse 85   Temp 98.3 F (36.8 C) (Oral)   Resp 14   Ht 5\' 4"  (1.626 m)   Wt 79.8 kg   LMP  09/05/2020 (Approximate)   SpO2 99%   BMI 30.21 kg/m   Visual Acuity Right Eye Distance:   Left Eye Distance:   Bilateral Distance:    Right Eye Near:   Left Eye Near:    Bilateral Near:     Physical Exam Constitutional:      General: She is not in acute distress.    Appearance: Normal appearance. She is not ill-appearing.  HENT:     Head: Normocephalic and atraumatic.  Eyes:     General:        Right eye: No discharge.        Left eye: No discharge.     Conjunctiva/sclera: Conjunctivae normal.  Cardiovascular:     Rate and Rhythm: Normal rate and regular rhythm.  Pulmonary:     Effort: Pulmonary effort is normal.     Breath sounds: Normal breath sounds. No wheezing, rhonchi or rales.  Abdominal:     General: There is no distension.     Palpations: Abdomen is soft.     Tenderness: There is no abdominal tenderness.  Neurological:     Mental Status: She is alert.  Psychiatric:        Mood and Affect: Mood normal.        Behavior: Behavior normal.    UC Treatments / Results  Labs (all labs ordered are listed, but only abnormal results are displayed) Labs Reviewed - No data to display  EKG Interpretation: Normal sinus rhythm with rate of 75.  Normal axis.  Normal intervals.  No ST or T wave changes.  Normal EKG.  Radiology No results found.  Procedures Procedures (including critical care time)  Medications Ordered in UC Medications - No data to display  Initial Impression / Assessment and Plan / UC Course  I have reviewed the triage vital signs and the nursing notes.  Pertinent labs & imaging results that were available during my care of the patient were reviewed by me and considered in my medical decision making (see chart for details).    35 year old female presents with GERD.  Starting on Protonix.  Advised to  avoid triggering foods.   Final Clinical Impressions(s) / UC Diagnoses   Final diagnoses:  Gastroesophageal reflux disease, unspecified  whether esophagitis present     Discharge Instructions     Protonix daily.  Avoid triggering foods.  Take care  Dr. Adriana Simas    ED Prescriptions    Medication Sig Dispense Auth. Provider   pantoprazole (PROTONIX) 40 MG tablet Take 1 tablet (40 mg total) by mouth daily. 90 tablet Everlene Other G, DO     PDMP not reviewed this encounter.   Tommie Sams, [place] 10/20/20 1916

## 2020-10-20 NOTE — Discharge Instructions (Signed)
Protonix daily.  Avoid triggering foods.  Take care  Dr. Adriana Simas

## 2020-10-20 NOTE — ED Triage Notes (Signed)
Patient states that she has history of GERD.  Patient states that she reports heartburn and epigatsric pain and bloating that started getting worse several days ago.  Patient also reports constipation.  Patient denies diarrhea and vomiting.  Patient reports some nausea.  Patient denies fevers. Patient denies chest pain.  Patient reports a persistent cough and some SOB.

## 2023-01-08 HISTORY — PX: SKIN CANCER EXCISION: SHX779

## 2023-01-22 ENCOUNTER — Ambulatory Visit
Admission: EM | Admit: 2023-01-22 | Discharge: 2023-01-22 | Disposition: A | Discharge status: Home / Self Care | Payer: Medicaid Other | Attending: Family Medicine | Admitting: Family Medicine

## 2023-01-22 DIAGNOSIS — R000 Tachycardia, unspecified: Secondary | ICD-10-CM | POA: Insufficient documentation

## 2023-01-22 DIAGNOSIS — R42 Dizziness and giddiness: Secondary | ICD-10-CM | POA: Diagnosis present

## 2023-01-22 LAB — CBC WITH DIFFERENTIAL/PLATELET
Abs Immature Granulocytes: 0.04 10*3/uL (ref 0.00–0.07)
Basophils Absolute: 0 10*3/uL (ref 0.0–0.1)
Basophils Relative: 0 %
Eosinophils Absolute: 0.2 10*3/uL (ref 0.0–0.5)
Eosinophils Relative: 2 %
HCT: 41.4 % (ref 36.0–46.0)
Hemoglobin: 14.2 g/dL (ref 12.0–15.0)
Immature Granulocytes: 0 %
Lymphocytes Relative: 20 %
Lymphs Abs: 1.9 10*3/uL (ref 0.7–4.0)
MCH: 31.8 pg (ref 26.0–34.0)
MCHC: 34.3 g/dL (ref 30.0–36.0)
MCV: 92.8 fL (ref 80.0–100.0)
Monocytes Absolute: 0.4 10*3/uL (ref 0.1–1.0)
Monocytes Relative: 4 %
Neutro Abs: 7.3 10*3/uL (ref 1.7–7.7)
Neutrophils Relative %: 74 %
Platelets: 231 10*3/uL (ref 150–400)
RBC: 4.46 MIL/uL (ref 3.87–5.11)
RDW: 12.9 % (ref 11.5–15.5)
WBC: 9.8 10*3/uL (ref 4.0–10.5)
nRBC: 0 % (ref 0.0–0.2)

## 2023-01-22 LAB — PREGNANCY, URINE: Preg Test, Ur: NEGATIVE

## 2023-01-22 LAB — BASIC METABOLIC PANEL
Anion gap: 5 (ref 5–15)
BUN: 8 mg/dL (ref 6–20)
CO2: 24 mmol/L (ref 22–32)
Calcium: 8.7 mg/dL — ABNORMAL LOW (ref 8.9–10.3)
Chloride: 104 mmol/L (ref 98–111)
Creatinine, Ser: 0.68 mg/dL (ref 0.44–1.00)
GFR, Estimated: >60 mL/min (ref >60)
Glucose, Bld: 125 mg/dL — ABNORMAL HIGH (ref 70–99)
Potassium: 3.6 mmol/L (ref 3.5–5.1)
Sodium: 133 mmol/L — ABNORMAL LOW (ref 135–145)

## 2023-01-22 MED ORDER — ONDANSETRON 4 MG PO TBDP
4.0000 mg | ORAL_TABLET | Freq: Once | ORAL | Status: AC
  Administered 2023-01-22: 4 mg via ORAL

## 2023-01-22 MED ORDER — ONDANSETRON 4 MG PO TBDP: 4.0000 mg | ORAL_TABLET | Freq: Three times a day (TID) | ORAL | 0 refills | Status: AC | PRN

## 2023-01-22 MED ORDER — SODIUM CHLORIDE 0.9 % IV BOLUS
1000.0000 mL | Freq: Once | INTRAVENOUS | Status: AC
  Administered 2023-01-22: 1000 mL via INTRAVENOUS

## 2023-01-22 MED ORDER — MECLIZINE HCL 25 MG PO TABS
25.0000 mg | ORAL_TABLET | Freq: Once | ORAL | Status: AC
  Administered 2023-01-22: 25 mg via ORAL

## 2023-01-22 NOTE — Discharge Instructions (Addendum)
Your blood work was mostly normal. You are not pregnant.  Be sure to stay hydrated over the next week. Monitor the color of your urine.   Stop by the pharmacy to pick up your prescriptions.  Follow up with your primary care provider as needed.  Go to ED for red flag symptoms, including; fevers you cannot reduce with Tylenol/Motrin, severe headaches, vision changes, numbness/weakness in part of the body, lethargy, confusion, intractable vomiting, severe dehydration, chest pain, breathing difficulty, severe persistent abdominal or pelvic pain, signs of severe infection (increased redness, swelling of an area), feeling faint or passing out, dizziness, etc. You should especially go to the ED for sudden acute worsening of condition if you do not elect to go at this time.

## 2023-01-22 NOTE — ED Triage Notes (Addendum)
Pt reports since Monday she has been real nauseated, loss of appetite x2 nights, emesis in the evenings. [place]. Pt states she noticed her bowel movement this morning was very very dark and tar like. Pt does not recall having a bowel movement yesterday. Pt does report she took a swig of pepto yesterday.

## 2023-01-22 NOTE — ED Provider Notes (Signed)
[facility]    CSN: [id number] Arrival date & time: 01/22/23  1123      History   Chief Complaint Chief Complaint  Patient presents with   Dizziness   Nausea    HPI Allison Bautista is a 37 y.o. female.   HPI   Allison Bautista presents for nausea and dizziness. [place] She drank alcohol every day while she was there. On her drive home, she felt awful. Has lots of nausea and felt dizzy. She felt like she couldn't function. Monday and Tuesday night before bed she needed to vomit. She had a very very dark bowel movement. She took Pepto Bismol yesterday. She has been hungry but feels crappy after eating. She has not been drinking water.  Patient's last menstrual period was 01/14/2023 (exact date).   Has headaches. Endorses abdominal pain and feels like she needs to have a bowel movement. No abdominal surgeries.  Denies fever, chest pain, leg swelling or shortness of breath.       Past Medical History:  Diagnosis Date   BMI 30.0-30.9,adult    Obesity affecting pregnancy in third trimester, antepartum     Patient Active Problem List   Diagnosis Date Noted   Labor and delivery, indication for care 07/07/2015   Obesity affecting pregnancy in third trimester, antepartum    BMI 30.0-30.9,adult     Past Surgical History:  Procedure Laterality Date   NO PAST SURGERIES     SKIN CANCER EXCISION  01/08/2023    OB History     Gravida  2   Para  2   Term  2   Preterm      AB      Living  2      SAB      IAB      Ectopic      Multiple  0   Live Births  2            Home Medications    Prior to Admission medications   Medication Sig Start Date End Date Taking? Authorizing Provider  desvenlafaxine (PRISTIQ) 50 MG 24 hr tablet Take 50 mg by mouth daily. 06/24/20  Yes [provider]  metoprolol succinate (TOPROL-XL) 25 MG 24 hr tablet Take by mouth. 04/09/19 01/22/23 Yes [provider]   ondansetron (ZOFRAN-ODT) 4 MG disintegrating tablet Take 1 tablet (4 mg total) by mouth every 8 (eight) hours as needed. 01/22/23  Yes Darriel Utter, DO  pantoprazole (PROTONIX) 40 MG tablet Take 1 tablet (40 mg total) by mouth daily. 10/20/20  Yes Cook, Jayce G, DO  famotidine (PEPCID) 20 MG tablet Take 20 mg by mouth 2 (two) times daily.    [provider]  cetirizine (ZYRTEC) 10 MG tablet Take 1 tablet (10 mg total) by mouth daily. If needed at night for itching not relieved by Claritin in the morning. 01/28/17 04/21/19  Hassan Rowan, MD  loratadine (CLARITIN) 10 MG tablet Take 1 tablet (10 mg total) by mouth daily. Take 1 tablet in the morning. As needed for itching. 01/28/17 04/21/19  Hassan Rowan, MD  ranitidine (ZANTAC) 150 MG capsule Take 1 capsule (150 mg total) by mouth 2 (two) times daily. 01/28/17 04/21/19  Hassan Rowan, MD    Family History Family History  Problem Relation Age of Onset   Depression Mother    Hypertension Mother    Hyperlipidemia Father    Hypertension Father    Heart disease Father  Social History Social History   Tobacco Use   Smoking status: Every Day    Packs/day: .5    Types: Cigarettes   Smokeless tobacco: Never  Vaping Use   Vaping Use: Never used  Substance Use Topics   Alcohol use: No   Drug use: No     Allergies   Doxycycline   Review of Systems Review of Systems: negative unless otherwise stated in HPI.      Physical Exam Triage Vital Signs ED Triage Vitals  Enc Vitals Group     BP 01/22/23 1149 123/84     Pulse Rate 01/22/23 1149 (!) 109     Resp --      Temp 01/22/23 1149 98.5 F (36.9 C)     Temp Source 01/22/23 1149 Oral     SpO2 01/22/23 1149 100 %     Weight --      Height --      Head Circumference --      Peak Flow --      Pain Score 01/22/23 1147 2     Pain Loc --      Pain Edu? --      Excl. in GC? --    No data found.   Updated Vital Signs BP 123/84 (BP Location: Left Arm)   Pulse (!) 109    Temp 98.5 F (36.9 C) (Oral)   LMP 01/14/2023 (Exact Date)   SpO2 100%   Visual Acuity Right Eye Distance:   Left Eye Distance:   Bilateral Distance:    Right Eye Near:   Left Eye Near:    Bilateral Near:     Physical Exam GEN:     alert, non-toxic appearing and no distress    HENT:  mucus membranes tachy, oropharyngeal without lesions or erythema,  nares patent, no nasal discharge EYES:   pupils equal and reactive, EOM intact, no Nystagmus  NECK:  supple, normal ROM RESP:  clear to auscultation bilaterally, no increased work of breathing  CVS:   regular rate and rhythm, no murmur, distal pulses intact   EXT:   normal ROM, atraumatic, no edema  NEURO:  alert, oriented, speech normal, CN 2-12 grossly intact, no facial droop,  sensation grossly intact, strength 5/5 bilateral UE and LE, normal coordination, Negative Romberg , normal gait Skin:   warm and dry, normal skin turgor      UC Treatments / Results  Labs (all labs ordered are listed, but only abnormal results are displayed) Labs Reviewed  BASIC METABOLIC PANEL - Abnormal; Notable for the following components:      Result Value   Sodium 133 (*)    Glucose, Bld 125 (*)    Calcium 8.7 (*)    All other components within normal limits  CBC WITH DIFFERENTIAL/PLATELET  PREGNANCY, URINE    EKG  If EKG performed, see my interpretation in the MDM section  Radiology No results found.   Procedures Procedures (including critical care time)  Medications Ordered in UC Medications  sodium chloride 0.9 % bolus 1,000 mL (0 mLs Intravenous Stopped 01/22/23 1355)  meclizine (ANTIVERT) tablet 25 mg (25 mg Oral Given 01/22/23 1313)  ondansetron (ZOFRAN-ODT) disintegrating tablet 4 mg (4 mg Oral Given 01/22/23 1314)    Initial Impression / Assessment and Plan / UC Course  I have reviewed the triage vital signs and the nursing notes.  Pertinent labs & imaging results that were available during my care of the patient were  reviewed by  me and considered in my medical decision making (see chart for details).       Patient is a 37 y.o. female  who presents for dizziness and fatigue after drinking alcohol over he past week while on vacation.  Overall patient is nontoxic-appearing and afebrile.  She is tachycardic. Discussed blood work and an IV fluids and she is agreeable. Orthostatic vital unremarkable. She has somewhat room-spinning dizziness concerning for vertigo. Given meclizine without much improvement. Zofran given for nausea. CBC without anemia or leukocytosis.  Urine pregnancy test was negative.  She has a very mild hyponatremia, sodium 133 otherwise BMP unremarkable.  EKG obtained showing normal sinus rhythm without acute ST or T wave changes; personally interpreted by me.  There may be an element of dehydration to this.  Advised patient to stay hydrated.    ED and return precautions given and patient voiced understanding. Discussed MDM, treatment plan and plan for follow-up with patient who agrees with plan.    Final Clinical Impressions(s) / UC Diagnoses   Final diagnoses:  Dizziness  Tachycardia     Discharge Instructions      Your blood work was mostly normal. You are not pregnant.  Be sure to stay hydrated over the next week. Monitor the color of your urine.   Stop by the pharmacy to pick up your prescriptions.  Follow up with your primary care provider as needed.  Go to ED for red flag symptoms, including; fevers you cannot reduce with Tylenol/Motrin, severe headaches, vision changes, numbness/weakness in part of the body, lethargy, confusion, intractable vomiting, severe dehydration, chest pain, breathing difficulty, severe persistent abdominal or pelvic pain, signs of severe infection (increased redness, swelling of an area), feeling faint or passing out, dizziness, etc. You should especially go to the ED for sudden acute worsening of condition if you do not elect to go at this time.        ED Prescriptions     Medication Sig Dispense Auth. Provider   ondansetron (ZOFRAN-ODT) 4 MG disintegrating tablet Take 1 tablet (4 mg total) by mouth every 8 (eight) hours as needed. 20 tablet Katha Cabal, DO      PDMP not reviewed this encounter.   Katha Cabal, DO 01/26/23 1902

## 2024-02-27 ENCOUNTER — Encounter: Payer: Self-pay | Admitting: Emergency Medicine

## 2024-02-27 ENCOUNTER — Ambulatory Visit: Admission: EM | Admit: 2024-02-27 | Discharge: 2024-02-27 | Disposition: A | Discharge status: Home / Self Care

## 2024-02-27 DIAGNOSIS — S50862A Insect bite (nonvenomous) of left forearm, initial encounter: Secondary | ICD-10-CM | POA: Diagnosis not present

## 2024-02-27 DIAGNOSIS — R21 Rash and other nonspecific skin eruption: Secondary | ICD-10-CM

## 2024-02-27 DIAGNOSIS — W57XXXA Bitten or stung by nonvenomous insect and other nonvenomous arthropods, initial encounter: Secondary | ICD-10-CM | POA: Diagnosis not present

## 2024-02-27 MED ORDER — PREDNISONE 10 MG PO TABS: ORAL_TABLET | ORAL | 0 refills | Status: AC

## 2024-02-27 MED ORDER — EPINEPHRINE 0.3 MG/0.3ML IJ SOAJ: 0.3000 mg | INTRAMUSCULAR | 0 refills | Status: AC | PRN

## 2024-02-27 MED ORDER — DEXAMETHASONE SODIUM PHOSPHATE 10 MG/ML IJ SOLN
10.0000 mg | Freq: Once | INTRAMUSCULAR | Status: AC
  Administered 2024-02-27: 10 mg via INTRAMUSCULAR

## 2024-02-27 NOTE — ED Provider Notes (Signed)
 [facility]    CSN: [id number] Arrival date & time: 02/27/24  1618      History   Chief Complaint Chief Complaint  Patient presents with   Insect Bite    Wasp sting    HPI Allison Bautista is a 38 y.o. female presenting for left forearm swelling/redness since yesterday. She reports wasp sting about 24 hours ago. She has been applying ice but swelling and pain has worsened. 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 Patient has not had any associated other rashes or facial swelling. No chest tightness or shortness of breath. Not taking any antihistamines.   HPI  Past Medical History:  Diagnosis Date   BMI 30.0-30.9,adult    Obesity affecting pregnancy in third trimester, antepartum     Patient Active Problem List   Diagnosis Date Noted   Labor and delivery, indication for care 07/07/2015   Obesity affecting pregnancy in third trimester, antepartum    BMI 30.0-30.9,adult     Past Surgical History:  Procedure Laterality Date   NO PAST SURGERIES     SKIN CANCER EXCISION  01/08/2023    OB History     Gravida  2   Para  2   Term  2   Preterm      AB      Living  2      SAB      IAB      Ectopic      Multiple  0   Live Births  2            Home Medications    Prior to Admission medications   Medication Sig Start Date End Date Taking? Authorizing Provider  desvenlafaxine (PRISTIQ) 50 MG 24 hr tablet Take 50 mg by mouth daily. 06/24/20  Yes [provider]  EPINEPHrine  0.3 mg/0.3 mL IJ SOAJ injection Inject 0.3 mg into the muscle as needed for anaphylaxis. 02/27/24  Yes Arvis Jolan NOVAK, PA-C  famotidine (PEPCID) 20 MG tablet Take 20 mg by mouth 2 (two) times daily.   Yes [provider]  metoprolol succinate (TOPROL-XL) 25 MG 24 hr tablet Take by mouth. 04/09/19 02/27/24 Yes [provider]  pantoprazole  (PROTONIX ) 40 MG tablet Take 1 tablet (40 mg total) by mouth daily. 10/20/20  Yes Cook, Jayce G, DO   predniSONE  (DELTASONE ) 10 MG tablet Take 6 tabs p.o. on day 1 and decrease by 1 tablet daily until complete 02/27/24  Yes Arvis Jolan B, PA-C  rosuvastatin (CRESTOR) 10 MG tablet Take 10 mg by mouth daily. 09/01/23 08/31/24 Yes [provider]  ondansetron  (ZOFRAN -ODT) 4 MG disintegrating tablet Take 1 tablet (4 mg total) by mouth every 8 (eight) hours as needed. 01/22/23   Brimage, Vondra, DO  cetirizine  (ZYRTEC ) 10 MG tablet Take 1 tablet (10 mg total) by mouth daily. If needed at night for itching not relieved by Claritin  in the morning. 01/28/17 04/21/19  Desiderio Beagle, MD  loratadine  (CLARITIN ) 10 MG tablet Take 1 tablet (10 mg total) by mouth daily. Take 1 tablet in the morning. As needed for itching. 01/28/17 04/21/19  Desiderio Beagle, MD  ranitidine  (ZANTAC ) 150 MG capsule Take 1 capsule (150 mg total) by mouth 2 (two) times daily. 01/28/17 04/21/19  Desiderio Beagle, MD    Family History Family History  Problem Relation Age of Onset   Depression Mother    Hypertension Mother    Hyperlipidemia Father    Hypertension Father    Heart disease  Father     Social History Social History   Tobacco Use   Smoking status: Every Day    Current packs/day: 0.50    Types: Cigarettes   Smokeless tobacco: Never  Vaping Use   Vaping status: Never Used  Substance Use Topics   Alcohol use: Yes   Drug use: No     Allergies   Bee venom and Doxycycline   Review of Systems Review of Systems  Constitutional:  Negative for fatigue and fever.  Musculoskeletal:  Positive for arthralgias and joint swelling.  Skin:  Positive for color change and rash.  Allergic/Immunologic: Positive for environmental allergies.  Neurological:  Negative for weakness and numbness.     Physical Exam Triage Vital Signs ED Triage Vitals  Encounter Vitals Group     BP 02/27/24 1643 117/79     Girls Systolic BP Percentile --      Girls Diastolic BP Percentile --      Boys Systolic BP Percentile --      Boys Diastolic  BP Percentile --      Pulse Rate 02/27/24 1643 79     Resp 02/27/24 1643 16     Temp 02/27/24 1643 98.8 F (37.1 C)     Temp Source 02/27/24 1643 Oral     SpO2 02/27/24 1643 99 %     Weight 02/27/24 1641 175 lb 14.8 oz (79.8 kg)     Height 02/27/24 1641 5' 4 (1.626 m)     Head Circumference --      Peak Flow --      Pain Score 02/27/24 1641 4     Pain Loc --      Pain Education --      Exclude from Growth Chart --    No data found.  Updated Vital Signs BP 117/79 (BP Location: Right Arm)   Pulse 79   Temp 98.8 F (37.1 C) (Oral)   Resp 16   Ht 5' 4 (1.626 m)   Wt 175 lb 14.8 oz (79.8 kg)   LMP 02/17/2024 (Approximate)   SpO2 99%   BMI 30.20 kg/m   Physical Exam Vitals and nursing note reviewed.  Constitutional:      General: She is not in acute distress.    Appearance: Normal appearance. She is not ill-appearing or toxic-appearing.  HENT:     Head: Normocephalic and atraumatic.  Eyes:     General: No scleral icterus.       Right eye: No discharge.        Left eye: No discharge.     Conjunctiva/sclera: Conjunctivae normal.  Cardiovascular:     Rate and Rhythm: Normal rate and regular rhythm.     Heart sounds: Normal heart sounds.  Pulmonary:     Effort: Pulmonary effort is normal. No respiratory distress.     Breath sounds: Normal breath sounds.  Musculoskeletal:     Cervical back: Neck supple.  Skin:    General: Skin is dry.     Findings: Rash present.     Comments: LEFT ARM: See images included in chart. There is moderate swelling of the left forearm with significant erythema mid forearm and elbow. No significant tenderness. Full ROM of elbow joint  Neurological:     General: No focal deficit present.     Mental Status: She is alert. Mental status is at baseline.     Motor: No weakness.     Gait: Gait normal.  Psychiatric:  Mood and Affect: Mood normal.        Behavior: Behavior normal.         UC Treatments / Results  Labs (all labs  ordered are listed, but only abnormal results are displayed) Labs Reviewed - No data to display  EKG   Radiology No results found.  Procedures Procedures (including critical care time)  Medications Ordered in UC Medications  dexamethasone (DECADRON) injection 10 mg (10 mg Intramuscular Given 02/27/24 1709)    Initial Impression / Assessment and Plan / UC Course  I have reviewed the triage vital signs and the nursing notes.  Pertinent labs & imaging results that were available during my care of the patient were reviewed by me and considered in my medical decision making (see chart for details).   38 year old female presents for wasp sting of left forearm that occurred yesterday.  She reports increased swelling, redness and pain.  Has applied ice but not taken any over-the-counter medications.  History of swelling related to bee stings.  No facial swelling, chest tightness, difficulty swallowing or breathing.  Requests a refill of her EpiPen .  See image included in chart.  Consistent with localized allergic reaction.  Patient given 10 mg IM dexamethasone.  Prednisone  taper to start tomorrow.  Antihistamines, ice and elevation.  Refilled EpiPen .  Reviewed return and ER precautions.   Final Clinical Impressions(s) / UC Diagnoses   Final diagnoses:  Rash  Insect bite of left forearm, initial encounter     Discharge Instructions      -Start prednisone  taper tomorrow since we gave you the injection tonight -Take 1-2 tabs of benadryl  tonight and continue during day or non drowsy Claritin  -Ice and elevate arm frequently to help with swelling    ED Prescriptions     Medication Sig Dispense Auth. Provider   predniSONE  (DELTASONE ) 10 MG tablet Take 6 tabs p.o. on day 1 and decrease by 1 tablet daily until complete 21 tablet Arvis Huxley B, PA-C   EPINEPHrine  0.3 mg/0.3 mL IJ SOAJ injection Inject 0.3 mg into the muscle as needed for anaphylaxis. 1 each Arvis Huxley KATHEE DEVONNA       PDMP not reviewed this encounter.   Arvis Huxley KATHEE, PA-C 02/27/24 1731

## 2024-02-27 NOTE — Discharge Instructions (Addendum)
-  Start prednisone  taper tomorrow since we gave you the injection tonight -Take 1-2 tabs of benadryl  tonight and continue during day or non drowsy Claritin  -Ice and elevate arm frequently to help with swelling

## 2024-02-27 NOTE — ED Triage Notes (Signed)
 Pt was stung by a wasp yesterday on her left elbow. She has localized redness and swelling. 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 She had drawn a line earlier today where the redness stopped and the redness has spread outside the ink marks. She states the has pain in the arm as well.
